# Patient Record
Sex: Female | Born: 1980
Health system: Southern US, Community
[De-identification: ages and names within clinical notes are randomized; demographics above are authoritative.]

## PROBLEM LIST (undated history)

## (undated) DIAGNOSIS — F419 Anxiety disorder, unspecified: Secondary | ICD-10-CM

## (undated) DIAGNOSIS — N83209 Unspecified ovarian cyst, unspecified side: Secondary | ICD-10-CM

## (undated) DIAGNOSIS — Z789 Other specified health status: Secondary | ICD-10-CM

## (undated) DIAGNOSIS — Z973 Presence of spectacles and contact lenses: Secondary | ICD-10-CM

## (undated) DIAGNOSIS — D649 Anemia, unspecified: Secondary | ICD-10-CM

## (undated) DIAGNOSIS — N858 Other specified noninflammatory disorders of uterus: Secondary | ICD-10-CM

## (undated) HISTORY — PX: NO PAST SURGERIES: SHX2092

## (undated) HISTORY — DX: Other specified noninflammatory disorders of uterus: N85.8

## (undated) HISTORY — DX: Anemia, unspecified: D64.9

## (undated) HISTORY — PX: TUBAL LIGATION: SHX77

## (undated) HISTORY — DX: Anxiety disorder, unspecified: F41.9

---

## 2003-04-30 ENCOUNTER — Ambulatory Visit (HOSPITAL_COMMUNITY): Admission: AD | Admit: 2003-04-30 | Discharge: 2003-04-30 | Payer: Self-pay | Admitting: *Deleted

## 2003-05-01 ENCOUNTER — Inpatient Hospital Stay (HOSPITAL_COMMUNITY): Admission: RE | Admit: 2003-05-01 | Discharge: 2003-05-03 | Payer: Self-pay | Admitting: Obstetrics and Gynecology

## 2003-08-26 ENCOUNTER — Emergency Department (HOSPITAL_COMMUNITY): Admission: EM | Admit: 2003-08-26 | Discharge: 2003-08-26 | Payer: Self-pay | Admitting: Emergency Medicine

## 2005-12-02 ENCOUNTER — Emergency Department (HOSPITAL_COMMUNITY): Admission: EM | Admit: 2005-12-02 | Discharge: 2005-12-03 | Payer: Self-pay | Admitting: Emergency Medicine

## 2006-02-17 ENCOUNTER — Ambulatory Visit (HOSPITAL_COMMUNITY): Admission: RE | Admit: 2006-02-17 | Discharge: 2006-02-17 | Payer: Self-pay | Admitting: Obstetrics & Gynecology

## 2006-07-19 ENCOUNTER — Inpatient Hospital Stay (HOSPITAL_COMMUNITY): Admission: AD | Admit: 2006-07-19 | Discharge: 2006-07-21 | Payer: Self-pay | Admitting: Obstetrics & Gynecology

## 2006-07-19 ENCOUNTER — Ambulatory Visit: Payer: Self-pay | Admitting: Obstetrics & Gynecology

## 2011-08-07 ENCOUNTER — Emergency Department
Admit: 2011-08-07 | Discharge: 2011-08-07 | Disposition: A | Discharge status: Home / Self Care | Payer: Self-pay | Source: Emergency Department | Admitting: Emergency Medicine

## 2011-08-07 LAB — URINALYSIS, REFLEX TO MICROSCOPIC EXAM IF INDICATED
Bilirubin, UA: NEGATIVE
Blood, UA: NEGATIVE
Glucose, UA: NEGATIVE
Ketones UA: NEGATIVE
Nitrite, UA: NEGATIVE
Protein, UR: NEGATIVE
Specific Gravity UA POCT: 1.018 (ref 1.001–1.035)
Urine pH: 6 (ref 5.0–8.0)
Urobilinogen, UA: NEGATIVE mg/dL

## 2012-09-26 ENCOUNTER — Emergency Department: Payer: Medicaid Other

## 2012-09-26 ENCOUNTER — Emergency Department
Admission: EM | Admit: 2012-09-26 | Discharge: 2012-09-26 | Disposition: A | Discharge status: Home / Self Care | Payer: Medicaid Other | Attending: Internal Medicine | Admitting: Internal Medicine

## 2012-09-26 DIAGNOSIS — R112 Nausea with vomiting, unspecified: Secondary | ICD-10-CM | POA: Insufficient documentation

## 2012-09-26 DIAGNOSIS — O99891 Other specified diseases and conditions complicating pregnancy: Secondary | ICD-10-CM | POA: Insufficient documentation

## 2012-09-26 LAB — COMPREHENSIVE METABOLIC PANEL
ALT: 9 U/L (ref 0–55)
AST (SGOT): 15 U/L (ref 5–34)
Albumin/Globulin Ratio: 1.2 (ref 0.9–2.2)
Albumin: 4.1 g/dL (ref 3.5–5.0)
Alkaline Phosphatase: 47 U/L (ref 40–150)
Anion Gap: 9 (ref 5.0–15.0)
BUN: 9 mg/dL (ref 7.0–19.0)
Bilirubin, Total: 1.1 mg/dL (ref 0.2–1.2)
CO2: 23 mEq/L (ref 22–29)
Calcium: 9.8 mg/dL (ref 8.5–10.5)
Chloride: 106 mEq/L (ref 98–107)
Creatinine: 0.8 mg/dL (ref 0.6–1.0)
Globulin: 3.3 g/dL (ref 2.0–3.6)
Glucose: 94 mg/dL (ref 70–100)
Potassium: 4.2 mEq/L (ref 3.5–5.1)
Protein, Total: 7.4 g/dL (ref 6.0–8.3)
Sodium: 138 mEq/L (ref 136–145)

## 2012-09-26 LAB — URINALYSIS, REFLEX TO MICROSCOPIC EXAM IF INDICATED
Bilirubin, UA: NEGATIVE
Blood, UA: NEGATIVE
Glucose, UA: NEGATIVE
Nitrite, UA: NEGATIVE
Protein, UR: 30 — AB
Specific Gravity UA: 1.031 (ref 1.001–1.035)
Urine pH: 6 (ref 5.0–8.0)
Urobilinogen, UA: NEGATIVE mg/dL

## 2012-09-26 LAB — CBC AND DIFFERENTIAL
Basophils Absolute Automated: 0.03 10*3/uL (ref 0.00–0.20)
Basophils Automated: 0 %
Eosinophils Absolute Automated: 0.12 10*3/uL (ref 0.00–0.70)
Eosinophils Automated: 2 %
Hematocrit: 36.4 % — ABNORMAL LOW (ref 37.0–47.0)
Hgb: 11.9 g/dL — ABNORMAL LOW (ref 12.0–16.0)
Immature Granulocytes Absolute: 0.01 10*3/uL
Immature Granulocytes: 0 %
Lymphocytes Absolute Automated: 1.58 10*3/uL (ref 0.50–4.40)
Lymphocytes Automated: 25 %
MCH: 26.4 pg — ABNORMAL LOW (ref 28.0–32.0)
MCHC: 32.7 g/dL (ref 32.0–36.0)
MCV: 80.7 fL (ref 80.0–100.0)
MPV: 11.1 fL (ref 9.4–12.3)
Monocytes Absolute Automated: 0.62 10*3/uL (ref 0.00–1.20)
Monocytes: 10 %
Neutrophils Absolute: 3.98 10*3/uL (ref 1.80–8.10)
Neutrophils: 63 %
Nucleated RBC: 0 /100 WBC (ref 0–1)
Platelets: 215 10*3/uL (ref 140–400)
RBC: 4.51 10*6/uL (ref 4.20–5.40)
RDW: 15 % (ref 12–15)
WBC: 6.33 10*3/uL (ref 3.50–10.80)

## 2012-09-26 LAB — HCG QUANTITATIVE: hCG, Quant.: 68629.1 m[IU]/mL

## 2012-09-26 LAB — POCT PREGNANCY TEST, URINE HCG: POCT Pregnancy HCG Test, UR: POSITIVE — AB

## 2012-09-26 LAB — LIPASE: Lipase: 17 U/L (ref 8–78)

## 2012-09-26 LAB — GFR: EGFR: >60

## 2012-09-26 LAB — HEMOLYSIS INDEX: Hemolysis Index: 4 Index (ref 0–18)

## 2012-09-26 MED ORDER — ONDANSETRON HCL 4 MG/2ML IJ SOLN
4.00 mg | Freq: Once | INTRAMUSCULAR | Status: AC
Start: 2012-09-26 — End: 2012-09-26
  Administered 2012-09-26: 4 mg via INTRAVENOUS
  Filled 2012-09-26: qty 2

## 2012-09-26 MED ORDER — SODIUM CHLORIDE 0.9 % IV BOLUS
1000.00 mL | Freq: Once | INTRAVENOUS | Status: AC
Start: 2012-09-26 — End: 2012-09-26
  Administered 2012-09-26: 1000 mL via INTRAVENOUS

## 2012-09-26 MED ORDER — ONDANSETRON 4 MG PO TBDP
4.00 mg | ORAL_TABLET | Freq: Four times a day (QID) | ORAL | Status: DC | PRN
Start: 2012-09-26 — End: 2016-01-28

## 2012-09-26 NOTE — ED Provider Notes (Signed)
EMERGENCY DEPARTMENT HISTORY AND PHYSICAL EXAM     Physician/Midlevel provider first contact with patient: 09/26/12 1317         Date: 09/26/2012  Patient Name: Debra Norman    History of Presenting Illness     Chief Complaint   Patient presents with   . Emesis     History Provided By: patient     Chief Complaint: vomiting   Onset: 1 week  Timing: persistent   Location: GI  Quality: aching   Severity: moderate   Modifying Factors:none    Associated Symptoms: nausea, abdominal pain      Additional History: Debra Norman is a 32 y.o. female P3/G2/A0, pregnant, presents to ED c/o persistent vomiting x 1 week which is dry-heaving.  Reports nausea and abdominal ache (feels like nausea). Denies burning w/ urination, constipation, diarrhea, vaginal bleeding. Pt reports a positive home pregnancy test. Pt has an upcoming appointment with an OB/GYN at the [facility] in 3 days. LNMP x ?08/07/2012. NKDA.     PCP: Pcp, Noneorunknown, MD      Current Facility-Administered Medications   Medication Dose Route Frequency Provider Last Rate Last Dose   . [COMPLETED] ondansetron (ZOFRAN) injection 4 mg  4 mg Intravenous Once Azzie Glatter, MD   4 mg at 09/26/12 1345   . [COMPLETED] sodium chloride 0.9 % bolus 1,000 mL  1,000 mL Intravenous Once Azzie Glatter, MD   1,000 mL at 09/26/12 1346     Current Outpatient Prescriptions   Medication Sig Dispense Refill   . ondansetron (ZOFRAN ODT) 4 MG disintegrating tablet Take 1 tablet (4 mg total) by mouth every 6 (six) hours as needed for Nausea.  20 tablet  0       Past History     Past Medical History:  History reviewed. No pertinent past medical history.    Past Surgical History:  History reviewed. No pertinent past surgical history.    Family History:  History reviewed. No pertinent family history.    Social History:  History   Substance Use Topics   . Smoking status: Never Smoker    . Smokeless tobacco: Not on file   . Alcohol Use: No       Allergies:  No Known Allergies    Review of Systems      Review of Systems   Constitutional: Negative for fever, chills and diaphoresis.   HENT: Negative for nosebleeds, congestion and rhinorrhea.    Eyes: Negative for pain, redness and visual disturbance.   Respiratory: Negative for cough and shortness of breath.    Cardiovascular: Negative for chest pain and leg swelling.   Gastrointestinal: Positive for nausea, vomiting and abdominal pain. Negative for diarrhea and constipation.   Genitourinary: Negative for dysuria, decreased urine volume and difficulty urinating.   Musculoskeletal: Negative for myalgias and back pain.   Skin: Negative for pallor and rash.   Neurological: Negative for dizziness and headaches.         Physical Exam   BP 101/58  Pulse 77  Temp 99.6 F (37.6 C) (Oral)  Resp 18  Ht 1.626 m  Wt 60.328 kg  BMI 22.82 kg/m2  SpO2 99%  LMP 10/09/2011  Physical Exam   Nursing note and vitals reviewed.  Constitutional: She is oriented to person, place, and time. She appears well-developed and well-nourished. No distress.   HENT:   Head: Normocephalic and atraumatic.   Eyes: Right eye exhibits no discharge. Left eye exhibits no discharge. No scleral  icterus.   Neck: Normal range of motion. Neck supple.   Cardiovascular: Normal rate, regular rhythm and normal heart sounds.    Pulmonary/Chest: Effort normal and breath sounds normal. No respiratory distress. She has no wheezes. She has no rales.   Abdominal: Soft. Bowel sounds are normal. She exhibits no distension. There is no tenderness. There is no rebound and no guarding.   Musculoskeletal: She exhibits no edema and no tenderness.   Neurological: She is alert and oriented to person, place, and time.   Skin: Skin is warm and dry. She is not diaphoretic.   Psychiatric: She has a normal mood and affect. Judgment normal.       Diagnostic Study Results     Labs -     Results     Procedure Component Value Units Date/Time    Beta HCG Quant Serum [[id number]] Collected:09/26/12 1333     hCG, Quant. 68629.1  mIU/mL Updated:09/26/12 1416    Comprehensive Metabolic Panel (CMP) [[id number]] Collected:09/26/12 1333    Specimen Information:Blood Updated:09/26/12 1354     Glucose 94 mg/dL      BUN 9.0 mg/dL      Creatinine 0.8 mg/dL      Sodium 244 mEq/L      Potassium 4.2 mEq/L      Chloride 106 mEq/L      CO2 23 mEq/L      Calcium 9.8 mg/dL      Protein, Total 7.4 g/dL      Albumin 4.1 g/dL      AST (SGOT) 15 U/L      ALT 9 U/L      Alkaline Phosphatase 47 U/L      Bilirubin, Total 1.1 mg/dL      Globulin 3.3 g/dL      Albumin/Globulin Ratio 1.2      Anion Gap 9.0     Lipase [[id number]] Collected:09/26/12 1333    Specimen Information:Blood Updated:09/26/12 1354     Lipase 17 U/L     HEMOLYZED INDEX [[id number]] Collected:09/26/12 1333     Hemolyzed Index 4 Index Updated:09/26/12 1354    GFR [[id number]] Collected:09/26/12 1333     EGFR >60.0 Updated:09/26/12 1354    UA, Reflex to Microscopic [[id number]]  (Abnormal) Collected:09/26/12 1333    Specimen Information:Urine Updated:09/26/12 1348     Urine Type Clean Catch      Color, UA Yellow      Clarity, UA Hazy      Specific Gravity UA 1.031      Urine pH 6.0      Leukocytes, UA Trace (A)      Nitrite, UA Negative      Protein, UA 30 (A)      Glucose, UA Negative      Ketones UA Trace (A)      Urobilinogen, UA Negative mg/dL      Bilirubin, UA Negative      Blood, UA Negative      RBC, UA 0 - 5 /HPF      WBC, UA 0 - 5 /HPF      Squamous Epithelial Cells, Urine 6 - 10 /HPF      Urine Mucus Present     CBC and differential [[id number]]  (Abnormal) Collected:09/26/12 1333    Specimen Information:Blood / Blood Updated:09/26/12 1340     WBC 6.33 x10 3/uL      RBC 4.51 x10 6/uL      Hgb 11.9 (L) g/dL      Hematocrit 60.1 (L) %  MCV 80.7 fL      MCH 26.4 (L) pg      MCHC 32.7 g/dL      RDW 15 %      Platelets 215 x10 3/uL      MPV 11.1 fL      Neutrophils 63 %      Lymphocytes Automated 25 %      Monocytes 10 %      Eosinophils Automated 2 %      Basophils Automated 0 %       Immature Granulocyte 0 %      Nucleated RBC 0 /100 WBC      Neutrophils Absolute 3.98 x10 3/uL      Abs Lymph Automated 1.58 x10 3/uL      Abs Mono Automated 0.62 x10 3/uL      Abs Eos Automated 0.12 x10 3/uL      Absolute Baso Automated 0.03 x10 3/uL      Absolute Immature Granulocyte 0.01 x10 3/uL     POCT Pregnancy Test, Urine HCG [[id number]]  (Abnormal) Collected:09/26/12 1311     POCT QC Pass Updated:09/26/12 1312     POCT Pregnancy HCG Test, UR Positive (A)      Comment:        Result:     Negative Value is Normal in Healthy Males or Healthy non-pregnant Females          Radiologic Studies -   Radiology Results (24 Hour)     ** No Results found for the last 24 hours. **      .      Medical Decision Making   I am the first provider for this patient.    I reviewed the vital signs, available nursing notes, past medical history, past surgical history, family history and social history.    Vital Signs- Reviewed the patient's vital signs.  Patient Vitals for the past 12 hrs:   BP Temp Pulse Resp   09/26/12 1448 101/58 mmHg 99.6 F (37.6 C) 77  18    09/26/12 1249 105/61 mmHg 96.4 F (35.8 C) 95  20      Pulse Oximetry Analysis - Normal 99% on RA    Old Medical Records: Old medical records.  Nursing notes.  Vital signs.      ED Course:   2:33 PM  Pt is feeling better and would like to go home.  Discussed test results with pt and counseled on diagnosis, f/u plans, and signs and symptoms when to return to ED.  Pt is stable and ready for discharge.      Provider Notes: pt comfortable on discharge.    Diagnosis     Clinical Impression:   1. Nausea and vomiting in pregnancy        _______________________________    Attestations:  This note is prepared by Karlton Lemon, acting as Scribe for Azzie Glatter, MD.     Azzie Glatter, MD: The scribe's documentation has been prepared under my direction and personally reviewed by me in its entirety. I confirm that the note above accurately reflects all work, treatment, procedures,  and medical decision making performed by me.      _______________________________        Azzie Glatter, MD  09/26/12 [phone]

## 2012-09-26 NOTE — ED Notes (Signed)
DCI reviewed.  Patient discharged by Dr. Nowak.

## 2012-09-26 NOTE — ED Notes (Signed)
Nausea, vomiting x1 week, generally tired.  Took a home pregnancy test Sunday which was positive and another on Thursday which was also positive.  Patient states she doesn't think she is pregnant.

## 2012-09-29 ENCOUNTER — Emergency Department: Payer: Medicaid Other

## 2012-09-29 ENCOUNTER — Emergency Department
Admission: EM | Admit: 2012-09-29 | Discharge: 2012-09-30 | Disposition: A | Discharge status: Home / Self Care | Payer: Medicaid Other | Attending: Emergency Medicine | Admitting: Emergency Medicine

## 2012-09-29 DIAGNOSIS — O468X9 Other antepartum hemorrhage, unspecified trimester: Secondary | ICD-10-CM | POA: Insufficient documentation

## 2012-09-29 NOTE — ED Provider Notes (Signed)
Physician/Midlevel provider first contact with patient: 09/29/12 2314            Date: 09/29/2012  Patient Name: Debra Norman  Chief Complaint:   Chief Complaint   Patient presents with   . Vaginal Bleeding         History Provided By: the patient.  Translation services was not used.  Sign language services was not used.    HPI      Chief Complaint   Vaginal Bleeding      The patient complains of vaginal bleeding. Onset of symptoms was abrupt starting 2.5 hours ago. Severity of symptoms now is mild. Symptoms occur spontaneously at rest. Symptoms have been constant. Symptoms are aggravated by nothing, alleviated by nothing and are associated with nausea and vomiting. Symptoms are described as light pink. Pt confirms pregnancy. LMP was 08/07/2012. Previous episodes include emergency room visit on 2 days ago for nausea and emesis. At her visit she discovered she was [redacted] weeks pregnant. Pt's risk factors for ectopic are none.            PCP: Pcp, Noneorunknown, MD    LMP: Patient's last menstrual period was 08/07/2012.    GYN HX:   Obstetric History    G3   P2   T0   P0   A0   TAB0   SAB0   E0   M0   L0        Blood Type: B+    PMH    History reviewed. No pertinent past medical history.    PSH    History reviewed. No pertinent past surgical history.    FH    History reviewed. No pertinent family history.    SH    History   Substance Use Topics   . Smoking status: Never Smoker    . Smokeless tobacco: Not on file   . Alcohol Use: No       ALLERGIES    No Known Allergies    CURRENT MEDS    No current facility-administered medications for this encounter.     Current Outpatient Prescriptions   Medication Sig Dispense Refill   . ondansetron (ZOFRAN ODT) 4 MG disintegrating tablet Take 1 tablet (4 mg total) by mouth every 6 (six) hours as needed for Nausea.  20 tablet  0           ROS    Review of Systems   Constitutional: Negative for fever, diaphoresis, activity change, appetite change and fatigue.   HENT: Negative for ear  pain, sore throat, rhinorrhea, neck pain and neck stiffness.    Eyes: Negative for pain, discharge, redness and visual disturbance.   Respiratory: Negative for apnea, cough, chest tightness, shortness of breath, wheezing and stridor.    Cardiovascular: Negative for chest pain, palpitations and leg swelling.   Gastrointestinal: Positive for nausea and vomiting. Negative for abdominal pain, diarrhea, blood in stool and abdominal distention.   Genitourinary: Positive for vaginal bleeding.   Musculoskeletal: Negative for myalgias and arthralgias.   Skin: Negative for color change, pallor and rash.   Neurological: Negative for dizziness, seizures, syncope, speech difficulty, weakness, numbness and headaches.   Hematological: Does not bruise/bleed easily.                 PHYSICAL EXAM    Vital Signs: BP 103/55  Pulse 86  Temp 99.5 F (37.5 C)  Resp 18  Ht 1.626 m  Wt 60.328 kg  BMI 22.82 kg/m2  SpO2 99%  LMP 08/07/2012        Pulse Oximetry: 99%   (RA unless specified)          Physical Exam   Nursing note and vitals reviewed.  Constitutional: She is oriented to person, place, and time. She appears well-developed and well-nourished. No distress.   HENT:   Head: Normocephalic and atraumatic.   Right Ear: External ear normal.   Left Ear: External ear normal.   Mouth/Throat: Oropharynx is clear and moist. No oropharyngeal exudate.   Eyes: Conjunctivae normal and EOM are normal. Pupils are equal, round, and reactive to light.   Neck: Normal range of motion. Neck supple. No JVD present.   Cardiovascular: Normal rate, regular rhythm and normal heart sounds.    Pulmonary/Chest: Effort normal and breath sounds normal. No stridor. No respiratory distress. She has no wheezes. She has no rales. She exhibits no tenderness.   Abdominal: Soft. She exhibits no distension and no mass. There is no tenderness. There is no rebound and no guarding.   Musculoskeletal: Normal range of motion.   Neurological: She is alert and oriented  to person, place, and time. No cranial nerve deficit.   Skin: Skin is warm and dry. No rash noted. She is not diaphoretic. No erythema. No pallor.   Psychiatric: She has a normal mood and affect. Her behavior is normal. Judgment and thought content normal.          EKG/MONITOR/OLD EKG    Current EKG: not performed    Old EKG:   not reviewed or none available    Cardiac Monitor: not monitored      LAB RESULTS    Results     Procedure Component Value Units Date/Time    ABO/Rh [[id number]] Collected:09/29/12 2345    Specimen Information:Blood Updated:09/30/12 0040     ABO Rh B POS     Beta HCG, Quant, Serum [[id number]] Collected:09/29/12 2345     hCG, Quant. 98403.1 mIU/mL Updated:09/30/12 0030    CBC and differential [[id number]]  (Abnormal) Collected:09/29/12 2345    Specimen Information:Blood / Blood Updated:09/30/12 0000     WBC 6.66 x10 3/uL      RBC 4.51 x10 6/uL      Hgb 11.9 (L) g/dL      Hematocrit 41.3 (L) %      MCV 80.7 fL      MCH 26.4 (L) pg      MCHC 32.7 g/dL      RDW 15 %      Platelets 226 x10 3/uL      MPV 11.3 fL      Neutrophils 53 %      Lymphocytes Automated 34 %      Monocytes 10 %      Eosinophils Automated 2 %      Basophils Automated 1 %      Immature Granulocyte 0 %      Nucleated RBC 1 /100 WBC      Neutrophils Absolute 3.52 x10 3/uL      Abs Lymph Automated 2.29 x10 3/uL      Abs Mono Automated 0.68 x10 3/uL      Abs Eos Automated 0.13 x10 3/uL      Absolute Baso Automated 0.04 x10 3/uL      Absolute Immature Granulocyte 0.01 x10 3/uL           RADIOLOGY RESULTS    Radiology Results (24 Hour)     ** No Results found  for the last 24 hours. **          CONSULTATIONS/NOTES      The reason(s) for the excessive length of stay for this patient include the following: still awaiting sonogram. It was ordered at 2325.  12:50 AM - Discussed test results with pt and counseled on diagnosis, f/u plans, and signs and symptoms when to return to ED.  Pt is stable and ready for discharge.       PROCEDURES    none    CRITICAL CARE TIME    none        DIAGNOSES  Primary diagnosis is in boldface    1. Live 7.2 week IUP    2. Subchorionic hemorrhage of placenta, first trimester        DISPOSITION    discharged home     DISPOSITION CONDITION    unchanged    Patient Vitals for the past 12 hrs:   BP Temp Pulse Resp   09/29/12 2255 103/55 mmHg 99.5 F (37.5 C) 86  18         Pulse Oximetry: 99%   (RA unless specified)      MEDICAL DECISION MAKING    Old records reviewed: no    Old EKG reviewed: not examined    Discussed with consultant(s) and admitting MD: no    Discussed with patient and/or family members: yes    Acuity level of diagnosis: medium/moderate complexity    Acuity level of procedures: not applicable    Complexity of differential diagnosis: medium/moderate complexity      CHART RECONCILIATION: CHART OWNERSHIP: Dr. Gaylord Shih is the primary emergency physician of record.              Harless Litten, MD  09/30/12 [phone]

## 2012-09-29 NOTE — ED Notes (Signed)
Pt reports went to OB-GYN checkup today with unremarkable report.  Pt reports bleeding started 2200 light pink one pad, denies pain

## 2012-09-30 LAB — CBC AND DIFFERENTIAL
Basophils Absolute Automated: 0.04 10*3/uL (ref 0.00–0.20)
Basophils Automated: 1 %
Eosinophils Absolute Automated: 0.13 10*3/uL (ref 0.00–0.70)
Eosinophils Automated: 2 %
Hematocrit: 36.4 % — ABNORMAL LOW (ref 37.0–47.0)
Hgb: 11.9 g/dL — ABNORMAL LOW (ref 12.0–16.0)
Immature Granulocytes Absolute: 0.01 10*3/uL
Immature Granulocytes: 0 %
Lymphocytes Absolute Automated: 2.29 10*3/uL (ref 0.50–4.40)
Lymphocytes Automated: 34 %
MCH: 26.4 pg — ABNORMAL LOW (ref 28.0–32.0)
MCHC: 32.7 g/dL (ref 32.0–36.0)
MCV: 80.7 fL (ref 80.0–100.0)
MPV: 11.3 fL (ref 9.4–12.3)
Monocytes Absolute Automated: 0.68 10*3/uL (ref 0.00–1.20)
Monocytes: 10 %
Neutrophils Absolute: 3.52 10*3/uL (ref 1.80–8.10)
Neutrophils: 53 %
Nucleated RBC: 1 /100 WBC (ref 0–1)
Platelets: 226 10*3/uL (ref 140–400)
RBC: 4.51 10*6/uL (ref 4.20–5.40)
RDW: 15 % (ref 12–15)
WBC: 6.66 10*3/uL (ref 3.50–10.80)

## 2012-09-30 LAB — HCG QUANTITATIVE: hCG, Quant.: 98403.1 m[IU]/mL

## 2012-09-30 LAB — ABO/RH: ABO Rh: B POS

## 2012-09-30 NOTE — Discharge Instructions (Signed)
Pregnancy, IUP Confirmed     It has been determined that you are pregnant.     You have an intra-uterine pregnancy or IUP. This means that your fetus (baby) is growing inside your uterus. This is normal. Some fetuses develop inside the fallopian tubes. This is called an ectopic pregnancy, and it is very dangerous.     Your doctor determined you have an IUP by examining you, conducting lab tests and viewing an ultrasound of your pelvis.     There is an extremely small chance that you could have an IUP AND a tubal pregnancy at the same time. This happens when there is a fetus growing in the uterus and a second fetus growing in a fallopian tube. This is called a heterotopic pregnancy. About 1 in every 30,000 pregnancies is heterotopic. This condition is EXTREMELY RARE, but it can be life-threatening.     At this time, there is no evidence that you have an ectopic (or heterotopic) pregnancy. Your physician believes it is safe for you to go home.     You will need to follow up with an obstetrician who can care for you during your pregnancy. If you do not already have an obstetrician, the medical staff here can help you to find one.     Make sure you see an obstetrician as soon as possible, certainly within the next few weeks. Tell your regular doctor about this visit and your pregnancy.     You should avoid taking most medications while you are pregnant unless your doctor says it is okay. It is safe to use Tylenol (acetaminophen), but be sure to follow the directions on the package.     Here are some things that you can do to keep your baby healthy:  · If you smoke, STOP! Smoking can hurt your fetus (baby) and you. Smoking also makes it more likely that you will have a miscarriage (lose the baby).  · Abusing alcohol or other drugs will hurt your fetus (baby). Risks include abnormal development, premature birth, severe health and mental problems once the child is born, and miscarriage. Avoid using alcohol or other drugs  until after your baby is born.  · If you are taking medications, including antipsychotics, antidepressants, or seizure medicine, check with your regular doctor right away. Your doctor can tell you whether it is safe to keep taking these drugs during your pregnancy.  · Keep yourself well-hydrated at all times by drinking a lot of water. Eat a balanced, healthy, nutritious diet.  · Take prenatal vitamins every day. These may be prescribed for you on this visit, or you may need to get them through your follow-up care provider. You can also purchase an over-the-counter equivalent which may be a lot less expensive. Ask you pharmacist for details.     You may need to return here or to your Obstetrician if you develop any other symptoms that might make you believe your are having complications with your pregnancy.     YOU SHOULD SEEK MEDICAL ATTENTION IMMEDIATELY, EITHER HERE OR AT THE NEAREST EMERGENCY DEPARTMENT, IF ANY OF THE FOLLOWING OCCURS:  · Nausea, vomiting, fever, or chills.  · Increasing pain in the abdomen (belly), pelvis or back.  · Passing large clots or fetal tissue or heavy vaginal bleeding that soaks a pad or tampon all the way through (more than one pad per hour).  · Dizziness, lightheadedness, or passing out.

## 2014-08-19 NOTE — L&D Delivery Note (Cosign Needed Addendum)
Pt was admitted in labor. She had a AROM with clear fluid. She continued to progress spontaneously. I contacted the nursing service to check pt since I had not received an update in awhile. One min later I received a call stating that she was complete and crowning. She then had a SVD over an intact perineum. Placenta S/I. EBL-400cc. Baby to NBN. She desires a BTL. Will proceed to the OR for BTL.    Delivery Note At 12:40 PM a viable female was delivered via Vaginal, Spontaneous Delivery (Presentation: Left Occiput Anterior).  APGAR: 8, 9; weight 7 lb 9 oz (3430 g).   Placenta status: Intact, Spontaneous.  Cord: 3 vessels with the following complications: None.  Cord pH: not collected  Anesthesia: Epidural  Episiotomy: None Lacerations: None Suture Repair: na Est. Blood Loss (mL): 350  I was called to the room for emergent delivery. Infant was at the perineum with a full crown. I delivered the placenta and provided verbal sign out to her OB provider.    Juanita Craver [facility] 07/09/2015, 5:52 PM

## 2015-01-03 LAB — OB RESULTS CONSOLE HEPATITIS B SURFACE ANTIGEN: HEP B S AG: NEGATIVE

## 2015-01-03 LAB — OB RESULTS CONSOLE ABO/RH: RH TYPE: NEGATIVE

## 2015-01-03 LAB — OB RESULTS CONSOLE RPR: RPR: NONREACTIVE

## 2015-01-03 LAB — OB RESULTS CONSOLE HIV ANTIBODY (ROUTINE TESTING): HIV: NONREACTIVE

## 2015-01-03 LAB — OB RESULTS CONSOLE RUBELLA ANTIBODY, IGM: Rubella: IMMUNE

## 2015-06-21 LAB — OB RESULTS CONSOLE GBS: STREP GROUP B AG: POSITIVE

## 2015-07-09 ENCOUNTER — Encounter (HOSPITAL_COMMUNITY): Payer: Self-pay | Admitting: *Deleted

## 2015-07-09 ENCOUNTER — Inpatient Hospital Stay (HOSPITAL_COMMUNITY): Payer: Medicaid Other | Admitting: Anesthesiology

## 2015-07-09 ENCOUNTER — Inpatient Hospital Stay (HOSPITAL_COMMUNITY)
Admission: AD | Admit: 2015-07-09 | Discharge: 2015-07-11 | DRG: 767 | Disposition: A | Discharge status: Home / Self Care | Payer: Medicaid Other | Source: Ambulatory Visit | Attending: Obstetrics and Gynecology | Admitting: Obstetrics and Gynecology

## 2015-07-09 ENCOUNTER — Encounter (HOSPITAL_COMMUNITY)
Admission: AD | Disposition: A | Discharge status: Home / Self Care | Payer: Self-pay | Source: Ambulatory Visit | Attending: Obstetrics and Gynecology

## 2015-07-09 DIAGNOSIS — Z3A39 39 weeks gestation of pregnancy: Secondary | ICD-10-CM

## 2015-07-09 DIAGNOSIS — O26893 Other specified pregnancy related conditions, third trimester: Secondary | ICD-10-CM | POA: Diagnosis present

## 2015-07-09 DIAGNOSIS — O99824 Streptococcus B carrier state complicating childbirth: Secondary | ICD-10-CM | POA: Diagnosis present

## 2015-07-09 DIAGNOSIS — Z302 Encounter for sterilization: Secondary | ICD-10-CM | POA: Diagnosis not present

## 2015-07-09 DIAGNOSIS — Z3480 Encounter for supervision of other normal pregnancy, unspecified trimester: Secondary | ICD-10-CM

## 2015-07-09 HISTORY — PX: TUBAL LIGATION: SHX77

## 2015-07-09 HISTORY — DX: Other specified health status: Z78.9

## 2015-07-09 LAB — CBC
HCT: 31 % — ABNORMAL LOW (ref 36.0–46.0)
Hemoglobin: 9.9 g/dL — ABNORMAL LOW (ref 12.0–15.0)
MCH: 24.9 pg — ABNORMAL LOW (ref 26.0–34.0)
MCHC: 31.9 g/dL (ref 30.0–36.0)
MCV: 77.9 fL — AB (ref 78.0–100.0)
PLATELETS: 160 10*3/uL (ref 150–400)
RBC: 3.98 MIL/uL (ref 3.87–5.11)
RDW: 17.8 % — AB (ref 11.5–15.5)
WBC: 5.4 10*3/uL (ref 4.0–10.5)

## 2015-07-09 LAB — ABO/RH: ABO/RH(D): B POS

## 2015-07-09 LAB — TYPE AND SCREEN
ABO/RH(D): B POS
ANTIBODY SCREEN: NEGATIVE

## 2015-07-09 LAB — RPR: RPR Ser Ql: NONREACTIVE

## 2015-07-09 SURGERY — LIGATION, FALLOPIAN TUBE, POSTPARTUM
Anesthesia: Epidural | Site: Abdomen | Laterality: Bilateral

## 2015-07-09 MED ORDER — OXYCODONE-ACETAMINOPHEN 5-325 MG PO TABS: 2.0000 | ORAL_TABLET | ORAL | Status: DC | PRN

## 2015-07-09 MED ORDER — ONDANSETRON HCL 4 MG/2ML IJ SOLN
4.0000 mg | Freq: Four times a day (QID) | INTRAMUSCULAR | Status: DC | PRN
  Administered 2015-07-09: 4 mg via INTRAVENOUS

## 2015-07-09 MED ORDER — ACETAMINOPHEN 325 MG PO TABS: 650.0000 mg | ORAL_TABLET | ORAL | Status: DC | PRN

## 2015-07-09 MED ORDER — FENTANYL 2.5 MCG/ML BUPIVACAINE 1/10 % EPIDURAL INFUSION (WH - ANES)
14.0000 mL/h | INTRAMUSCULAR | Status: DC | PRN
  Administered 2015-07-09: 14 mL/h via EPIDURAL
  Filled 2015-07-09: qty 125

## 2015-07-09 MED ORDER — LIDOCAINE HCL (PF) 1 % IJ SOLN
30.0000 mL | INTRAMUSCULAR | Status: DC | PRN
  Filled 2015-07-09: qty 30

## 2015-07-09 MED ORDER — FLEET ENEMA 7-19 GM/118ML RE ENEM: 1.0000 | ENEMA | RECTAL | Status: DC | PRN

## 2015-07-09 MED ORDER — LIDOCAINE-EPINEPHRINE (PF) 2 %-1:200000 IJ SOLN
INTRAMUSCULAR | Status: DC | PRN
  Administered 2015-07-09: 2 mL via EPIDURAL
  Administered 2015-07-09 (×2): 3 mL via EPIDURAL

## 2015-07-09 MED ORDER — ACETAMINOPHEN 325 MG PO TABS: 325.0000 mg | ORAL_TABLET | ORAL | Status: DC | PRN

## 2015-07-09 MED ORDER — PENICILLIN G POTASSIUM 5000000 UNITS IJ SOLR
2.5000 10*6.[IU] | INTRAVENOUS | Status: DC
  Administered 2015-07-09: 2.5 10*6.[IU] via INTRAVENOUS
  Filled 2015-07-09 (×6): qty 2.5

## 2015-07-09 MED ORDER — SIMETHICONE 80 MG PO CHEW: 80.0000 mg | CHEWABLE_TABLET | ORAL | Status: DC | PRN

## 2015-07-09 MED ORDER — IBUPROFEN 600 MG PO TABS: 600.0000 mg | ORAL_TABLET | Freq: Four times a day (QID) | ORAL | Status: DC

## 2015-07-09 MED ORDER — BENZOCAINE-MENTHOL 20-0.5 % EX AERO: 1.0000 "application " | INHALATION_SPRAY | CUTANEOUS | Status: DC | PRN

## 2015-07-09 MED ORDER — FENTANYL CITRATE (PF) 100 MCG/2ML IJ SOLN: 25.0000 ug | INTRAMUSCULAR | Status: DC | PRN

## 2015-07-09 MED ORDER — LACTATED RINGERS IV SOLN
INTRAVENOUS | Status: DC
Start: 2015-07-09 — End: 2015-07-09
  Administered 2015-07-09 (×3): via INTRAVENOUS

## 2015-07-09 MED ORDER — CEFAZOLIN SODIUM-DEXTROSE 2-3 GM-% IV SOLR
INTRAVENOUS | Status: DC | PRN
  Administered 2015-07-09: 2 g via INTRAVENOUS

## 2015-07-09 MED ORDER — OXYTOCIN 40 UNITS IN LACTATED RINGERS INFUSION - SIMPLE MED
62.5000 mL/h | INTRAVENOUS | Status: DC
  Administered 2015-07-09: 40 mL via INTRAVENOUS
  Filled 2015-07-09: qty 1000

## 2015-07-09 MED ORDER — OXYTOCIN BOLUS FROM INFUSION: 500.0000 mL | INTRAVENOUS | Status: DC

## 2015-07-09 MED ORDER — IBUPROFEN 600 MG PO TABS
600.0000 mg | ORAL_TABLET | Freq: Four times a day (QID) | ORAL | Status: DC
  Administered 2015-07-09 – 2015-07-11 (×8): 600 mg via ORAL
  Filled 2015-07-09 (×8): qty 1

## 2015-07-09 MED ORDER — BUPIVACAINE HCL (PF) 0.25 % IJ SOLN
INTRAMUSCULAR | Status: DC | PRN
  Administered 2015-07-09 (×2): 4 mL via EPIDURAL

## 2015-07-09 MED ORDER — LIDOCAINE-EPINEPHRINE (PF) 2 %-1:200000 IJ SOLN
INTRAMUSCULAR | Status: DC | PRN
  Administered 2015-07-09: 3 mL

## 2015-07-09 MED ORDER — WITCH HAZEL-GLYCERIN EX PADS: 1.0000 "application " | MEDICATED_PAD | CUTANEOUS | Status: DC | PRN

## 2015-07-09 MED ORDER — ONDANSETRON HCL 4 MG/2ML IJ SOLN: 4.0000 mg | INTRAMUSCULAR | Status: DC | PRN

## 2015-07-09 MED ORDER — EPHEDRINE 5 MG/ML INJ: 10.0000 mg | INTRAVENOUS | Status: DC | PRN

## 2015-07-09 MED ORDER — ZOLPIDEM TARTRATE 5 MG PO TABS: 5.0000 mg | ORAL_TABLET | Freq: Every evening | ORAL | Status: DC | PRN

## 2015-07-09 MED ORDER — BUPIVACAINE HCL (PF) 0.25 % IJ SOLN
INTRAMUSCULAR | Status: AC
  Filled 2015-07-09: qty 30

## 2015-07-09 MED ORDER — SENNOSIDES-DOCUSATE SODIUM 8.6-50 MG PO TABS
2.0000 | ORAL_TABLET | ORAL | Status: DC
  Administered 2015-07-09 – 2015-07-11 (×2): 2 via ORAL
  Filled 2015-07-09 (×2): qty 2

## 2015-07-09 MED ORDER — MIDAZOLAM HCL 2 MG/2ML IJ SOLN
INTRAMUSCULAR | Status: AC
  Filled 2015-07-09: qty 2

## 2015-07-09 MED ORDER — CITRIC ACID-SODIUM CITRATE 334-500 MG/5ML PO SOLN: 30.0000 mL | ORAL | Status: DC | PRN

## 2015-07-09 MED ORDER — LACTATED RINGERS IV SOLN: 500.0000 mL | INTRAVENOUS | Status: DC | PRN

## 2015-07-09 MED ORDER — TETANUS-DIPHTH-ACELL PERTUSSIS 5-2.5-18.5 LF-MCG/0.5 IM SUSP: 0.5000 mL | Freq: Once | INTRAMUSCULAR | Status: DC

## 2015-07-09 MED ORDER — MEASLES, MUMPS & RUBELLA VAC ~~LOC~~ INJ: 0.5000 mL | INJECTION | Freq: Once | SUBCUTANEOUS | Status: DC

## 2015-07-09 MED ORDER — ONDANSETRON HCL 4 MG PO TABS: 4.0000 mg | ORAL_TABLET | ORAL | Status: DC | PRN

## 2015-07-09 MED ORDER — PHENYLEPHRINE 40 MCG/ML (10ML) SYRINGE FOR IV PUSH (FOR BLOOD PRESSURE SUPPORT)
80.0000 ug | PREFILLED_SYRINGE | INTRAVENOUS | Status: DC | PRN
  Filled 2015-07-09: qty 20

## 2015-07-09 MED ORDER — DIBUCAINE 1 % RE OINT: 1.0000 "application " | TOPICAL_OINTMENT | RECTAL | Status: DC | PRN

## 2015-07-09 MED ORDER — OXYCODONE-ACETAMINOPHEN 5-325 MG PO TABS: 1.0000 | ORAL_TABLET | ORAL | Status: DC | PRN

## 2015-07-09 MED ORDER — MEASLES, MUMPS & RUBELLA VAC ~~LOC~~ INJ
0.5000 mL | INJECTION | Freq: Once | SUBCUTANEOUS | Status: DC
  Filled 2015-07-09: qty 0.5

## 2015-07-09 MED ORDER — MIDAZOLAM HCL 2 MG/2ML IJ SOLN
INTRAMUSCULAR | Status: DC | PRN
  Administered 2015-07-09 (×2): 1 mg via INTRAVENOUS

## 2015-07-09 MED ORDER — WITCH HAZEL-GLYCERIN EX PADS
1.0000 | MEDICATED_PAD | CUTANEOUS | Status: DC | PRN
Start: 2015-07-09 — End: 2015-07-09

## 2015-07-09 MED ORDER — OXYCODONE HCL 5 MG PO TABS
5.0000 mg | ORAL_TABLET | Freq: Once | ORAL | Status: DC | PRN
Start: 2015-07-09 — End: 2015-07-09

## 2015-07-09 MED ORDER — DIPHENHYDRAMINE HCL 50 MG/ML IJ SOLN: 12.5000 mg | INTRAMUSCULAR | Status: DC | PRN

## 2015-07-09 MED ORDER — PENICILLIN G POTASSIUM 5000000 UNITS IJ SOLR
5.0000 10*6.[IU] | Freq: Once | INTRAVENOUS | Status: AC
  Administered 2015-07-09: 5 10*6.[IU] via INTRAVENOUS
  Filled 2015-07-09: qty 5

## 2015-07-09 MED ORDER — BUTORPHANOL TARTRATE 1 MG/ML IJ SOLN
1.0000 mg | INTRAMUSCULAR | Status: DC | PRN
  Administered 2015-07-09: 1 mg via INTRAVENOUS
  Filled 2015-07-09: qty 1

## 2015-07-09 MED ORDER — OXYCODONE-ACETAMINOPHEN 5-325 MG PO TABS
1.0000 | ORAL_TABLET | ORAL | Status: DC | PRN
  Administered 2015-07-09 – 2015-07-10 (×2): 1 via ORAL
  Filled 2015-07-09 (×2): qty 1

## 2015-07-09 MED ORDER — SENNOSIDES-DOCUSATE SODIUM 8.6-50 MG PO TABS: 2.0000 | ORAL_TABLET | ORAL | Status: DC

## 2015-07-09 MED ORDER — BUPIVACAINE HCL (PF) 0.25 % IJ SOLN
INTRAMUSCULAR | Status: DC | PRN
  Administered 2015-07-09: 3 mL

## 2015-07-09 MED ORDER — OXYCODONE HCL 5 MG/5ML PO SOLN: 5.0000 mg | Freq: Once | ORAL | Status: DC | PRN

## 2015-07-09 MED ORDER — ACETAMINOPHEN 160 MG/5ML PO SOLN: 325.0000 mg | ORAL | Status: DC | PRN

## 2015-07-09 SURGICAL SUPPLY — 26 items
BLADE SURG 11 STRL SS (BLADE) ×2 IMPLANT
CATH ROBINSON RED A/P 16FR (CATHETERS) ×2 IMPLANT
CLIP FILSHIE TUBAL LIGA STRL (Clip) ×1 IMPLANT
CLOTH BEACON ORANGE TIMEOUT ST (SAFETY) ×2 IMPLANT
CONTAINER PREFILL 10% NBF 15ML (MISCELLANEOUS) IMPLANT
DRSG OPSITE POSTOP 3X4 (GAUZE/BANDAGES/DRESSINGS) ×2 IMPLANT
ELECT REM PT RETURN 9FT ADLT (ELECTROSURGICAL) ×2
ELECTRODE REM PT RTRN 9FT ADLT (ELECTROSURGICAL) ×1 IMPLANT
GLOVE BIOGEL PI IND STRL 7.0 (GLOVE) ×1 IMPLANT
GLOVE BIOGEL PI INDICATOR 7.0 (GLOVE) ×1
GLOVE ECLIPSE 7.0 STRL STRAW (GLOVE) ×4 IMPLANT
GOWN STRL REUS W/TWL LRG LVL3 (GOWN DISPOSABLE) ×4 IMPLANT
NEEDLE HYPO 22GX1.5 SAFETY (NEEDLE) IMPLANT
NS IRRIG 1000ML POUR BTL (IV SOLUTION) ×2 IMPLANT
PACK ABDOMINAL MINOR (CUSTOM PROCEDURE TRAY) ×2 IMPLANT
PENCIL BUTTON HOLSTER BLD 10FT (ELECTRODE) ×2 IMPLANT
SPONGE LAP 4X18 X RAY DECT (DISPOSABLE) IMPLANT
SUT MNCRL AB 0 CT1 27 (SUTURE) ×2 IMPLANT
SUT MON AB 2-0 CT1 36 (SUTURE) ×1 IMPLANT
SUT PLAIN 0 NONE (SUTURE) IMPLANT
SUT VIC AB 2-0 CT1 27 (SUTURE) ×2
SUT VIC AB 2-0 CT1 TAPERPNT 27 (SUTURE) IMPLANT
SUT VICRYL RAPIDE 4/0 PS 2 (SUTURE) ×2 IMPLANT
SYR CONTROL 10ML LL (SYRINGE) IMPLANT
TOWEL OR 17X24 6PK STRL BLUE (TOWEL DISPOSABLE) ×4 IMPLANT
WATER STERILE IRR 1000ML POUR (IV SOLUTION) ×1 IMPLANT

## 2015-07-09 NOTE — H&P (Signed)
Pt is a 34 y/o black female, G4P3003 at term who was admitted in labor. PNC was uncomplicated. She is GBS+. She desires a PPBTL PMHX: See hollister PE: VSSAF        HEENT-wnl        ABD- gravid, non tender        FHTs-reactive IMP/ IUP at term         +GBS PLAN/ Admit            Start PCN

## 2015-07-09 NOTE — Anesthesia Preprocedure Evaluation (Signed)
Anesthesia Evaluation  Patient identified by MRN, date of birth, ID band Patient awake    Reviewed: Allergy & Precautions, Patient's Chart, lab work & pertinent test results  History of Anesthesia Complications Negative for: history of anesthetic complications  Airway Mallampati: II  TM Distance: >3 FB Neck ROM: Full    Dental  (+) Teeth Intact   Pulmonary neg pulmonary ROS,    breath sounds clear to auscultation- rhonchi       Cardiovascular negative cardio ROS   Rhythm:Regular     Neuro/Psych negative neurological ROS  negative psych ROS   GI/Hepatic negative GI ROS, Neg liver ROS,   Endo/Other  negative endocrine ROS  Renal/GU negative Renal ROS     Musculoskeletal   Abdominal   Peds  Hematology negative hematology ROS (+)   Anesthesia Other Findings   Reproductive/Obstetrics (+) Pregnancy                             Anesthesia Physical Anesthesia Plan  ASA: II  Anesthesia Plan: Epidural   Post-op Pain Management:    Induction: Intravenous  Airway Management Planned: Nasal Cannula and Simple Face Mask  Additional Equipment: None  Intra-op Plan:   Post-operative Plan:   Informed Consent: I have reviewed the patients History and Physical, chart, labs and discussed the procedure including the risks, benefits and alternatives for the proposed anesthesia with the patient or authorized representative who has indicated his/her understanding and acceptance.   Dental advisory given  Plan Discussed with: CRNA and Surgeon  Anesthesia Plan Comments:         Anesthesia Quick Evaluation

## 2015-07-09 NOTE — Progress Notes (Signed)
Dr Harrington Challenger notified of pt's admission and status. Aware of ctx pattern, variable, pos scalp stim., FHR reactive but  minimal variability currently currtnly. Will admit to BS.

## 2015-07-09 NOTE — Anesthesia Postprocedure Evaluation (Signed)
Anesthesia Post Note  Patient: Cassandra Vargas  Procedure(s) Performed: Procedure(s) (LRB): POST PARTUM TUBAL LIGATION (Bilateral)  Patient location during evaluation: PACU Anesthesia Type: General Level of consciousness: awake and alert Pain management: pain level controlled Vital Signs Assessment: post-procedure vital signs reviewed and stable Respiratory status: spontaneous breathing Cardiovascular status: stable Postop Assessment: No signs of nausea or vomiting Anesthetic complications: no    Last Vitals:  Filed Vitals:   07/09/15 1640 07/09/15 2013  BP: 105/62 110/62  Pulse: 80 82  Temp: 37.1 C 37.2 C  Resp: 20 18    Last Pain:  Filed Vitals:   07/09/15 2019  PainSc: 0-No pain                 Martisha Toulouse

## 2015-07-09 NOTE — OR Nursing (Signed)
Pt arrived to pacu at 14:30. [employer]. Admits pt . Assesses pt. 14;30 116/69,72,14,98.1 dressing to umbilicus cdi, fundus u/e scant vaginal, paim score 1, 14:45 116/77,67,14,97% room air. Darin Engels Rn arrives at 14:55 to take over care of pt. Darin Engels rn

## 2015-07-09 NOTE — MAU Note (Signed)
Contractions for an hour. Denies LOF or bleeding. 2.5cm last exam

## 2015-07-09 NOTE — Anesthesia Preprocedure Evaluation (Signed)
Anesthesia Evaluation  Patient identified by MRN, date of birth, ID band Patient awake    Reviewed: Allergy & Precautions, NPO status , Patient's Chart, lab work & pertinent test results  Airway Mallampati: II  TM Distance: >3 FB Neck ROM: Full    Dental no notable dental hx.    Pulmonary neg pulmonary ROS,    Pulmonary exam normal breath sounds clear to auscultation       Cardiovascular negative cardio ROS Normal cardiovascular exam Rhythm:Regular Rate:Normal     Neuro/Psych negative neurological ROS  negative psych ROS   GI/Hepatic negative GI ROS, Neg liver ROS,   Endo/Other  negative endocrine ROS  Renal/GU negative Renal ROS  negative genitourinary   Musculoskeletal negative musculoskeletal ROS (+)   Abdominal   Peds negative pediatric ROS (+)  Hematology negative hematology ROS (+)   Anesthesia Other Findings   Reproductive/Obstetrics negative OB ROS                             Anesthesia Physical Anesthesia Plan  ASA: II  Anesthesia Plan: Epidural   Post-op Pain Management:    Induction:   Airway Management Planned:   Additional Equipment:   Intra-op Plan:   Post-operative Plan:   Informed Consent: I have reviewed the patients History and Physical, chart, labs and discussed the procedure including the risks, benefits and alternatives for the proposed anesthesia with the patient or authorized representative who has indicated his/her understanding and acceptance.   Dental advisory given  Plan Discussed with: Anesthesiologist  Anesthesia Plan Comments:         Anesthesia Quick Evaluation

## 2015-07-09 NOTE — Progress Notes (Signed)
Transported Cassandra Vargas to nursery after VS and measurements taken. Mother to OR for tubal and father left to pick up other siblings.

## 2015-07-09 NOTE — Anesthesia Procedure Notes (Signed)
Epidural Patient location during procedure: OB  Staffing Anesthesiologist: Merida Alcantar Performed by: anesthesiologist   Preanesthetic Checklist Completed: patient identified, surgical consent, pre-op evaluation, timeout performed, IV checked, risks and benefits discussed and monitors and equipment checked  Epidural Patient position: sitting Prep: DuraPrep Patient monitoring: heart rate, cardiac monitor, continuous pulse ox and blood pressure Approach: midline Location: L3-L4 Injection technique: LOR saline  Needle:  Needle type: Tuohy  Needle gauge: 17 G Needle length: 9 cm Needle insertion depth: 6 cm Catheter type: closed end flexible Catheter size: 19 Gauge Catheter at skin depth: 12 cm Test dose: negative and 2% lidocaine with Epi 1:200 K  Assessment Events: blood not aspirated, injection not painful, no injection resistance, negative IV test and no paresthesia  Additional Notes Reason for block:procedure for pain   

## 2015-07-09 NOTE — Transfer of Care (Signed)
Immediate Anesthesia Transfer of Care Note  Patient: Cassandra Vargas  Procedure(s) Performed: Procedure(s): POST PARTUM TUBAL LIGATION (Bilateral)  Patient Location: PACU  Anesthesia Type:Epidural  Level of Consciousness: awake and alert   Airway & Oxygen Therapy: Patient Spontanous Breathing  Post-op Assessment: Report given to RN and Post -op Vital signs reviewed and stable  Post vital signs: Reviewed and stable  Last Vitals:  Filed Vitals:   07/09/15 1342 07/09/15 1344  BP: 119/84 106/82  Pulse: 72 81  Temp:    Resp: 18 18    Complications: No apparent anesthesia complications

## 2015-07-09 NOTE — MAU Note (Signed)
Report called to [facility] RN in BS. Pt may go to 163

## 2015-07-10 MED ORDER — OXYCODONE-ACETAMINOPHEN 5-325 MG PO TABS: 1.0000 | ORAL_TABLET | ORAL | Status: DC | PRN

## 2015-07-10 NOTE — Anesthesia Postprocedure Evaluation (Signed)
  Anesthesia Post-op Note  Patient: Cassandra Vargas  Procedure(s) Performed: * No procedures listed *  Patient Location: Mother/Baby  Anesthesia Type:Epidural  Level of Consciousness: awake, alert , oriented and patient cooperative  Airway and Oxygen Therapy: Patient Spontanous Breathing  Post-op Pain: none  Post-op Assessment: Post-op Vital signs reviewed, Patient's Cardiovascular Status Stable, Respiratory Function Stable, Patent Airway, No headache, No backache and Patient able to bend at knees              Post-op Vital Signs: Reviewed and stable  Last Vitals:  Filed Vitals:   07/09/15 2327 07/10/15 0420  BP: 108/54 104/62  Pulse: 78 64  Temp: 36.7 C 36.6 C  Resp: 20 18    Complications: No apparent anesthesia complications

## 2015-07-10 NOTE — Addendum Note (Signed)
Addendum  created 07/10/15 0818 by Raenette Rover, CRNA   Modules edited: Notes Section   Notes Section:  File: [id number]

## 2015-07-10 NOTE — Anesthesia Postprocedure Evaluation (Signed)
  Anesthesia Post-op Note  Patient: Cassandra Vargas  Procedure(s) Performed: Procedure(s): POST PARTUM TUBAL LIGATION (Bilateral)  Patient Location: Mother/Baby  Anesthesia Type:Epidural  Level of Consciousness: awake, alert , oriented and patient cooperative  Airway and Oxygen Therapy: Patient Spontanous Breathing  Post-op Pain: none  Post-op Assessment: Post-op Vital signs reviewed, Patient's Cardiovascular Status Stable, Respiratory Function Stable, Patent Airway, No headache, No backache and Patient able to bend at knees              Post-op Vital Signs: Reviewed and stable  Last Vitals:  Filed Vitals:   07/09/15 2327 07/10/15 0420  BP: 108/54 104/62  Pulse: 78 64  Temp: 36.7 C 36.6 C  Resp: 20 18    Complications: No apparent anesthesia complications

## 2015-07-10 NOTE — Lactation Note (Signed)
This note was copied from the chart of Cassandra Erienne Wegner. Lactation Consultation Note  Patient Name: Cassandra Vargas [id number] Date: 07/10/2015 Reason for consult: Follow-up assessment Per Mom she is planning to BF some feedings and pump/bottle feed some feedings. At this visit baby recently BF and had supplement so unable to observe latch. Mom does not have DEBP at home, planning to purchase pump from [place]. Discussed the importance of having good DEBP to be successful if she does mostly pump/bottle feed as she did with her last child. Gave Mom WIC number to call today regarding pump as well. Stressed to [employer] importance of stimulation to breast at least every 3 hours. Discussed nipple confusion with BR/BO this early. Mom to call with next feeding for latch check, LC left phone number for Mom to call.   Maternal Data    Feeding Feeding Type: Formula Length of feed: 6 min  LATCH Score/Interventions Latch: Repeated attempts needed to sustain latch, nipple held in mouth throughout feeding, stimulation needed to elicit sucking reflex. Intervention(s): Assist with latch  Audible Swallowing: A few with stimulation Intervention(s): Skin to skin;Hand expression Intervention(s): Skin to skin;Hand expression  Type of Nipple: Everted at rest and after stimulation  Comfort (Breast/Nipple): Soft / non-tender     Hold (Positioning): Assistance needed to correctly position infant at breast and maintain latch.  LATCH Score: 7  Lactation Tools Discussed/Used Tools: Pump Breast pump type: Double-Electric Breast Pump WIC Program: Yes   Consult Status Consult Status: Follow-up Date: 07/10/15 Follow-up type: In-patient    Katrine Coho 07/10/2015, 11:44 AM

## 2015-07-10 NOTE — Op Note (Signed)
Cassandra Vargas, LANTHIER NO.:  [id number]  MEDICAL RECORD NO.:  [id number]  LOCATION:  [id number]                          FACILITY:  [facility]  PHYSICIAN:  Freda Munro, M.D.    DATE OF BIRTH:  [birth year]  DATE OF PROCEDURE:  07/09/2015 DATE OF DISCHARGE:                              OPERATIVE REPORT   PREOPERATIVE DIAGNOSIS:  Patient desires permanent sterilization.  POSTOPERATIVE DIAGNOSIS:  Patient desires permanent sterilization.  PROCEDURE:  Postpartum bilateral tubal ligation with Filshie clips.  SURGEON:  Freda Munro, M.D.  ANESTHESIA:  Epidural.  ANTIBIOTICS:  Ancef 2 g.  DRAINS:  Foley bedside drainage.  ESTIMATED BLOOD LOSS:  Minimal.  SPECIMENS:  None.  FINDINGS:  The patient had normal fallopian tubes bilaterally.  PROCEDURE IN DETAIL:  The patient was taken to the operating room, where epidural anesthetic was reinjected.  Once an adequate level was reached, the patient was prepped and draped in usual fashion for this procedure. A Foley catheter was placed in her bladder.  Her umbilicus was then injected with 0.25% Marcaine.  A 2-cm infraumbilical incision was made. The fascia was grasped, the fascia was entered with the Metzenbaum scissors.  The parietal peritoneum was entered sharply.  The patient was then placed in Trendelenburg.  The [employer] retractors were then placed.  The right fallopian tube was then grasped with a Babcock and carried to its fimbriated end for identification.  A Filshie clip was then placed in the isthmic portion.  The clip was placed perpendicular to the tube.  The entire tube appeared to be within the clasp.  The clasp appeared to be tightly closed.  At this point, a similar procedure was performed on the opposite side.  Following this, the instruments were removed.  The parietal peritoneum and fascia were closed using 2-0 Monocryl in a running fashion.  The skin was closed using 4-0 Rapide in a subcuticular fashion.   The patient tolerated the procedure well.  She was taken to recovery room in stable condition.  Instrument and lap counts correct x3.          ______________________________ Freda Munro, M.D.     MA/MEDQ  D:  07/09/2015  T:  07/09/2015  Job:  [id number]

## 2015-07-10 NOTE — Discharge Summary (Signed)
Obstetric Discharge Summary Reason for Admission: onset of labor Prenatal Procedures: ultrasound Intrapartum Procedures: spontaneous vaginal delivery Postpartum Procedures: P.P. tubal ligation Complications-Operative and Postpartum: none HEMOGLOBIN  Date Value Ref Range Status  07/09/2015 9.9* 12.0 - 15.0 g/dL Final   HCT  Date Value Ref Range Status  07/09/2015 31.0* 36.0 - 46.0 % Final    Physical Exam:  General: alert and cooperative Lochia: appropriate Uterine Fundus: firm Incision: healing well, no significant drainage, no significant erythema DVT Evaluation: No evidence of DVT seen on physical exam.  Discharge Diagnoses: Term Pregnancy-delivered  Discharge Information: Date: 07/10/2015 Activity: pelvic rest Diet: routine Medications: PNV, Ibuprofen and Percocet Condition: stable Instructions: refer to practice specific booklet Discharge to: home Follow-up Information    Follow up with Olga Millers, MD In 4 weeks.   Specialty:  Obstetrics and Gynecology   Contact information:   [address] [place] [id number] [phone]       Newborn Data: Live born female  Birth Weight: 7 lb 9 oz (3430 g) APGAR: 8, 9  Home with mother, baby may stay add'l day  Allyn Kenner 07/10/2015, 10:11 AM

## 2015-07-10 NOTE — Lactation Note (Signed)
This note was copied from the chart of Cassandra Vargas. Lactation Consultation Note Mom chooses to pump and bottle feed. She doesn't like the baby on the breast. Has BF once and bottle fed from here on. Hand expression and breast massage taught collect 11ml thick colostrum. Has long pendulum breast w/everted nipples. Rt. Nipple extra large, I;m not sure baby could latch to that nipple or not. #30 flange given for Rt. Nipple. Lt. Nipple #27 fits well. Mom shown how to use DEBP & how to disassemble, clean, & reassemble parts.Mom knows to pump q3h for 15-20 min. Mom encouraged to feed baby 8-12 times/24 hours and with feeding cues. Mom encouraged to waken baby for feeds. Educated about newborn behavior, I&O, supply and demand. Mom encouraged to do skin-to-skin. [place] brochure given w/resources, support groups and [place] services. Mom has 50 yr old that she mainly pumped and bottle fed for 6 weeks. Plans on purchasing DEBP when she gets home. Has WIC. Patient Name: Cassandra Argusta Mikkelsen [id number] Date: 07/10/2015 Reason for consult: Initial assessment   Maternal Data Has patient been taught Hand Expression?: Yes Does the patient have breastfeeding experience prior to this delivery?: Yes  Feeding Feeding Type: Breast Fed  LATCH Score/Interventions Latch: Repeated attempts needed to sustain latch, nipple held in mouth throughout feeding, stimulation needed to elicit sucking reflex.  Audible Swallowing: A few with stimulation  Type of Nipple: Everted at rest and after stimulation (Rt. nipple extra large)  Comfort (Breast/Nipple): Soft / non-tender     Hold (Positioning): Assistance needed to correctly position infant at breast and maintain latch.  LATCH Score: 7  Lactation Tools Discussed/Used Tools: Pump;Flanges Flange Size: 30 Breast pump type: Double-Electric Breast Pump WIC Program: Yes Pump Review: Setup, frequency, and cleaning;Milk Storage Initiated by:: Allayne Stack RN Date initiated::  07/10/15   Consult Status Consult Status: Follow-up Date: 07/11/15 Follow-up type: In-patient    Evan Mackie, Elta Guadeloupe 07/10/2015, 2:17 AM

## 2015-07-11 ENCOUNTER — Encounter (HOSPITAL_COMMUNITY): Payer: Self-pay | Admitting: Obstetrics and Gynecology

## 2015-07-11 MED ORDER — IBUPROFEN 600 MG PO TABS: 600.0000 mg | ORAL_TABLET | Freq: Four times a day (QID) | ORAL | Status: DC | PRN

## 2015-07-11 MED ORDER — OXYCODONE-ACETAMINOPHEN 5-325 MG PO TABS: 1.0000 | ORAL_TABLET | ORAL | Status: DC | PRN

## 2015-07-11 MED ORDER — DOCUSATE SODIUM 100 MG PO CAPS: 100.0000 mg | ORAL_CAPSULE | Freq: Two times a day (BID) | ORAL | Status: DC

## 2015-07-11 NOTE — Lactation Note (Signed)
This note was copied from the chart of Cassandra Jaslynn Remsen. Lactation Consultation Note  Discussed various type of pumps. It is best to pump w/ DEBP.  Provided mother w/ manual breastpump. Encouarged her to breastfeed 8-12 times a day.  Mother states baby is latching well. If she decided to pump explained she needs to pump approx every 3 hours. Reviewed engorgement care and monitoring voids/stools.   Patient Name: Cassandra Vargas [id number] Date: 07/11/2015 Reason for consult: Follow-up assessment   Maternal Data    Feeding Feeding Type: Bottle Fed - Formula Nipple Type: Slow - flow  LATCH Score/Interventions                      Lactation Tools Discussed/Used     Consult Status Consult Status: Complete    Carlye Grippe 07/11/2015, 8:57 AM

## 2015-10-11 ENCOUNTER — Emergency Department (HOSPITAL_COMMUNITY)
Admission: EM | Admit: 2015-10-11 | Discharge: 2015-10-11 | Disposition: A | Discharge status: Home / Self Care | Payer: Medicaid Other | Attending: Emergency Medicine | Admitting: Emergency Medicine

## 2015-10-11 ENCOUNTER — Encounter (HOSPITAL_COMMUNITY): Payer: Self-pay | Admitting: Emergency Medicine

## 2015-10-11 ENCOUNTER — Emergency Department (HOSPITAL_COMMUNITY): Payer: Medicaid Other

## 2015-10-11 DIAGNOSIS — J111 Influenza due to unidentified influenza virus with other respiratory manifestations: Secondary | ICD-10-CM | POA: Diagnosis not present

## 2015-10-11 DIAGNOSIS — R509 Fever, unspecified: Secondary | ICD-10-CM | POA: Diagnosis present

## 2015-10-11 DIAGNOSIS — Z79899 Other long term (current) drug therapy: Secondary | ICD-10-CM | POA: Insufficient documentation

## 2015-10-11 DIAGNOSIS — R000 Tachycardia, unspecified: Secondary | ICD-10-CM | POA: Insufficient documentation

## 2015-10-11 DIAGNOSIS — R69 Illness, unspecified: Secondary | ICD-10-CM

## 2015-10-11 LAB — COMPREHENSIVE METABOLIC PANEL
ALK PHOS: 59 U/L (ref 38–126)
ALT: 10 U/L — AB (ref 14–54)
ANION GAP: 12 (ref 5–15)
AST: 16 U/L (ref 15–41)
Albumin: 3.9 g/dL (ref 3.5–5.0)
BUN: <5 mg/dL — ABNORMAL LOW (ref 6–20)
CALCIUM: 9.5 mg/dL (ref 8.9–10.3)
CHLORIDE: 102 mmol/L (ref 101–111)
CO2: 23 mmol/L (ref 22–32)
CREATININE: 0.75 mg/dL (ref 0.44–1.00)
GFR calc Af Amer: >60 mL/min (ref >60)
GFR calc non Af Amer: >60 mL/min (ref >60)
GLUCOSE: 101 mg/dL — AB (ref 65–99)
Potassium: 3.6 mmol/L (ref 3.5–5.1)
Sodium: 137 mmol/L (ref 135–145)
Total Bilirubin: 0.6 mg/dL (ref 0.3–1.2)
Total Protein: 7.4 g/dL (ref 6.5–8.1)

## 2015-10-11 LAB — URINALYSIS, ROUTINE W REFLEX MICROSCOPIC
Bilirubin Urine: NEGATIVE
GLUCOSE, UA: NEGATIVE mg/dL
Hgb urine dipstick: NEGATIVE
KETONES UR: NEGATIVE mg/dL
LEUKOCYTES UA: NEGATIVE
Nitrite: NEGATIVE
PH: 7 (ref 5.0–8.0)
Protein, ur: NEGATIVE mg/dL
SPECIFIC GRAVITY, URINE: 1.007 (ref 1.005–1.030)

## 2015-10-11 LAB — CBC WITH DIFFERENTIAL/PLATELET
Basophils Absolute: 0 10*3/uL (ref 0.0–0.1)
Basophils Relative: 0 %
Eosinophils Absolute: 0 10*3/uL (ref 0.0–0.7)
Eosinophils Relative: 0 %
HEMATOCRIT: 31.6 % — AB (ref 36.0–46.0)
HEMOGLOBIN: 10.2 g/dL — AB (ref 12.0–15.0)
LYMPHS ABS: 0.5 10*3/uL — AB (ref 0.7–4.0)
LYMPHS PCT: 9 %
MCH: 26.8 pg (ref 26.0–34.0)
MCHC: 32.3 g/dL (ref 30.0–36.0)
MCV: 83.2 fL (ref 78.0–100.0)
MONOS PCT: 6 %
Monocytes Absolute: 0.3 10*3/uL (ref 0.1–1.0)
NEUTROS ABS: 4.2 10*3/uL (ref 1.7–7.7)
Neutrophils Relative %: 85 %
Platelets: 178 10*3/uL (ref 150–400)
RBC: 3.8 MIL/uL — AB (ref 3.87–5.11)
RDW: 15.2 % (ref 11.5–15.5)
WBC: 5 10*3/uL (ref 4.0–10.5)

## 2015-10-11 LAB — I-STAT CG4 LACTIC ACID, ED
Lactic Acid, Venous: 1.05 mmol/L (ref 0.5–2.0)
Lactic Acid, Venous: 0.49 mmol/L — ABNORMAL LOW (ref 0.5–2.0)

## 2015-10-11 LAB — TROPONIN I

## 2015-10-11 MED ORDER — VANCOMYCIN HCL IN DEXTROSE 1-5 GM/200ML-% IV SOLN
1000.0000 mg | Freq: Once | INTRAVENOUS | Status: DC
  Filled 2015-10-11: qty 200

## 2015-10-11 MED ORDER — PIPERACILLIN-TAZOBACTAM 3.375 G IVPB 30 MIN
3.3750 g | Freq: Once | INTRAVENOUS | Status: AC
  Administered 2015-10-11: 3.375 g via INTRAVENOUS
  Filled 2015-10-11: qty 50

## 2015-10-11 MED ORDER — SODIUM CHLORIDE 0.9 % IV BOLUS (SEPSIS)
1000.0000 mL | INTRAVENOUS | Status: AC
  Administered 2015-10-11 (×2): 1000 mL via INTRAVENOUS

## 2015-10-11 MED ORDER — PIPERACILLIN-TAZOBACTAM 3.375 G IVPB: 3.3750 g | Freq: Three times a day (TID) | INTRAVENOUS | Status: DC

## 2015-10-11 MED ORDER — BENZONATATE 100 MG PO CAPS: 100.0000 mg | ORAL_CAPSULE | Freq: Three times a day (TID) | ORAL | Status: DC

## 2015-10-11 MED ORDER — VANCOMYCIN HCL IN DEXTROSE 1-5 GM/200ML-% IV SOLN: 1000.0000 mg | Freq: Three times a day (TID) | INTRAVENOUS | Status: DC

## 2015-10-11 MED ORDER — ACETAMINOPHEN 325 MG PO TABS
650.0000 mg | ORAL_TABLET | Freq: Once | ORAL | Status: AC | PRN
  Administered 2015-10-11: 650 mg via ORAL
  Filled 2015-10-11: qty 2

## 2015-10-11 MED ORDER — SODIUM CHLORIDE 0.9 % IV BOLUS (SEPSIS)
500.0000 mL | INTRAVENOUS | Status: AC
  Administered 2015-10-11: 500 mL via INTRAVENOUS

## 2015-10-11 MED ORDER — VANCOMYCIN HCL 10 G IV SOLR
1500.0000 mg | Freq: Once | INTRAVENOUS | Status: AC
  Administered 2015-10-11: 1500 mg via INTRAVENOUS
  Filled 2015-10-11: qty 1500

## 2015-10-11 MED ORDER — OSELTAMIVIR PHOSPHATE 75 MG PO CAPS: 75.0000 mg | ORAL_CAPSULE | Freq: Two times a day (BID) | ORAL | Status: DC

## 2015-10-11 NOTE — Discharge Instructions (Signed)

## 2015-10-11 NOTE — ED Notes (Signed)
Pt from home for eval of cough, generalized body aches x3 days with sob that started this morning, pt febrile in triage and tachycardic. Lung sounds clear, denies any n/v/d or cp at this time.

## 2015-10-11 NOTE — ED Notes (Signed)
Pt remains monitored by blood pressure, pulse ox, and 5 lead.  

## 2015-10-11 NOTE — Consult Note (Signed)
Pharmacy Antibiotic Note  Cassandra Vargas is a 35 y.o. female admitted on 10/11/2015 with sepsis.  Pharmacy has been consulted for vanc/zosyn dosing.  Pt presented w/ cough, generalized body aches for 3 days, and SOB that started this AM. She is febrile to 102.8, tachycardic, and hypotensive. Code Sepsis called.  Plan: Vanc 1500 mg IV x1, then 1 g IV q8h Zosyn 3.375 g IV q8h Monitor renal function, VT prn, cultures, LOT  Height: 5\' 4"  (162.6 cm) Weight: 178 lb 2.1 oz (80.8 kg) IBW/kg (Calculated) : 54.7  Temp (24hrs), Avg:101.9 F (38.8 C), Min:100.9 F (38.3 C), Max:102.8 F (39.3 C)   Recent Labs Lab 10/11/15 1202 10/11/15 1205  CREATININE  --  0.75  LATICACIDVEN 1.05  --     Estimated Creatinine Clearance: 103.8 mL/min (by C-G formula based on Cr of 0.75).    No Known Allergies  Antimicrobials this admission: Vanc 2/22 >>  Zosyn 2/22 >>   Dose adjustments this admission: n/a  Microbiology results: 2/22 BCx:  2/22 UCx:    Thank you for allowing pharmacy to be a part of this patient's care.  Joya San, PharmD Clinical Pharmacy Resident Pager # [phone] 10/11/2015 2:17 PM   Pharmacy Code Sepsis Protocol  Time of code sepsis page: 13:05 [x]  Antibiotics delivered at 13:24 (vanc only) Zosyn pulled and given prior to vanc delivery # 1310  Were antibiotics ordered at the time of the code sepsis page? Yes Was it required to contact the physician? [x]  Physician not contacted []  Physician contacted to order antibiotics for code sepsis []  Physician contacted to recommend changing antibiotics  Pharmacy consulted for: vanc/zosyn  Anti-infectives    Start     Dose/Rate Route Frequency Ordered Stop   10/11/15 2230  vancomycin (VANCOCIN) IVPB 1000 mg/200 mL premix     1,000 mg 200 mL/hr over 60 Minutes Intravenous Every 8 hours 10/11/15 1417     10/11/15 2115  piperacillin-tazobactam (ZOSYN) IVPB 3.375 g     3.375 g 12.5 mL/hr over 240 Minutes Intravenous  Every 8 hours 10/11/15 1343     10/11/15 1330  vancomycin (VANCOCIN) 1,500 mg in sodium chloride 0.9 % 500 mL IVPB     1,500 mg 250 mL/hr over 120 Minutes Intravenous  Once 10/11/15 1320     10/11/15 1300  piperacillin-tazobactam (ZOSYN) IVPB 3.375 g     3.375 g 100 mL/hr over 30 Minutes Intravenous  Once 10/11/15 1245 10/11/15 1340   10/11/15 1300  vancomycin (VANCOCIN) IVPB 1000 mg/200 mL premix  Status:  Discontinued     1,000 mg 200 mL/hr over 60 Minutes Intravenous  Once 10/11/15 1245 10/11/15 1320        Nurse education provided: []  Minutes left to administer antibiotics to achieve 1 hour goal []  Correct order of antibiotic administration []  Antibiotic Y-site compatibilities     Joya San, PharmD Clinical Pharmacy Resident Pager # [phone] 10/11/2015 2:17 PM

## 2015-10-11 NOTE — ED Provider Notes (Signed)
CSN: [id number]     Arrival date & time 10/11/15  1111 History   First MD Initiated Contact with Patient 10/11/15 1224     Chief Complaint  Patient presents with  . Fever  . Tachycardia    Patient is a 35 y.o. female presenting with cough.  Cough Cough characteristics:  Non-productive Severity:  Moderate Onset quality:  Sudden Duration:  1 day Timing:  Intermittent Progression:  Worsening Chronicity:  New Relieved by:  Nothing Ineffective treatments: OTC meds. Associated symptoms: fever, rhinorrhea and shortness of breath   Associated symptoms: no diaphoresis, no headaches, no myalgias, no rash and no sore throat   Pt's daughter has been sick.  No foreign travel. Pt did have her vaccinations.  NO dysuria.   No vomiting or diarrhea.  No pain anywhere.  NO flu vacc this year.  Past Medical History  Diagnosis Date  . Medical history non-contributory    Past Surgical History  Procedure Laterality Date  . No past surgeries    . Tubal ligation Bilateral 07/09/2015    Procedure: POST PARTUM TUBAL LIGATION;  Surgeon: Olga Millers, MD;  Location: [facility];  Service: Gynecology;  Laterality: Bilateral;   Family History  Problem Relation Age of Onset  . Alcohol abuse Neg Hx    Social History  Substance Use Topics  . Smoking status: Never Smoker   . Smokeless tobacco: None  . Alcohol Use: No   OB History    Gravida Para Term Preterm AB TAB SAB Ectopic Multiple Living   4 4 4  0 0 0 0 0 0 4     Review of Systems  Constitutional: Positive for fever. Negative for diaphoresis.  HENT: Positive for rhinorrhea. Negative for sore throat.   Respiratory: Positive for cough and shortness of breath.   Musculoskeletal: Negative for myalgias.  Skin: Negative for rash.  Neurological: Positive for light-headedness. Negative for headaches.  All other systems reviewed and are negative.     Allergies  Review of patient's allergies indicates no known allergies.  Home Medications    Prior to Admission medications   Medication Sig Start Date End Date Taking? Authorizing Provider  Diphenhydramine-PE-APAP (THERAFLU WARMING RELIEF FLU) 12.5-5-325 MG/15ML LIQD Take 15 mLs by mouth 2 (two) times daily.   Yes Historical Provider, MD  benzonatate (TESSALON) 100 MG capsule Take 1 capsule (100 mg total) by mouth every 8 (eight) hours. 10/11/15   Dorie Rank, MD  docusate sodium (COLACE) 100 MG capsule Take 1 capsule (100 mg total) by mouth 2 (two) times daily. Patient not taking: Reported on 10/11/2015 07/11/15   Vanessa Kick, MD  ibuprofen (ADVIL,MOTRIN) 600 MG tablet Take 1 tablet (600 mg total) by mouth every 6 (six) hours as needed. Patient not taking: Reported on 10/11/2015 07/11/15   Vanessa Kick, MD  oseltamivir (TAMIFLU) 75 MG capsule Take 1 capsule (75 mg total) by mouth 2 (two) times daily. 10/11/15   Dorie Rank, MD  oxyCODONE-acetaminophen (PERCOCET/ROXICET) 5-325 MG tablet Take 1 tablet by mouth every 4 (four) hours as needed (for pain scale 4-7). Patient not taking: Reported on 10/11/2015 07/10/15   Allyn Kenner, DO  oxyCODONE-acetaminophen (ROXICET) 5-325 MG tablet Take 1-2 tablets by mouth every 4 (four) hours as needed for severe pain. Patient not taking: Reported on 10/11/2015 07/11/15   Vanessa Kick, MD   BP 102/55 mmHg  Pulse 108  Temp(Src) 101.3 F (38.5 C) (Oral)  Resp 16  Ht 5\' 4"  (1.626 m)  Wt 80.8 kg  BMI 30.56 kg/m2  SpO2 97%  LMP 10/02/2015 Physical Exam  Constitutional: She appears well-developed and well-nourished. No distress.  HENT:  Head: Normocephalic and atraumatic.  Right Ear: External ear normal.  Left Ear: External ear normal.  Mouth/Throat: No oropharyngeal exudate.  Eyes: Conjunctivae are normal. Pupils are equal, round, and reactive to light. Right eye exhibits no discharge. Left eye exhibits no discharge. No scleral icterus.  Neck: Normal range of motion. Neck supple. No tracheal deviation present.  Cardiovascular: Regular rhythm and  intact distal pulses.   Extrasystoles are present. Tachycardia present.   Pulmonary/Chest: Effort normal and breath sounds normal. No stridor. No respiratory distress. She has no wheezes. She has no rales.  Abdominal: Soft. Bowel sounds are normal. She exhibits no distension. There is no tenderness. There is no rebound and no guarding.  Musculoskeletal: She exhibits no edema or tenderness.  Neurological: She is alert. She has normal strength. No cranial nerve deficit (no facial droop, extraocular movements intact, no slurred speech) or sensory deficit. She exhibits normal muscle tone. She displays no seizure activity. Coordination normal.  Skin: Skin is warm and dry. No rash noted.  Psychiatric: She has a normal mood and affect.  Nursing note and vitals reviewed.   ED Course  Procedures (including critical care time) Labs Review Labs Reviewed  COMPREHENSIVE METABOLIC PANEL - Abnormal; Notable for the following:    Glucose, Bld 101 (*)    BUN <5 (*)    ALT 10 (*)    All other components within normal limits  CBC WITH DIFFERENTIAL/PLATELET - Abnormal; Notable for the following:    RBC 3.80 (*)    Hemoglobin 10.2 (*)    HCT 31.6 (*)    Lymphs Abs 0.5 (*)    All other components within normal limits  I-STAT CG4 LACTIC ACID, ED - Abnormal; Notable for the following:    Lactic Acid, Venous 0.49 (*)    All other components within normal limits  CULTURE, BLOOD (ROUTINE X 2)  CULTURE, BLOOD (ROUTINE X 2)  URINE CULTURE  URINALYSIS, ROUTINE W REFLEX MICROSCOPIC (NOT AT [facility])  TROPONIN I  CBC WITH DIFFERENTIAL/PLATELET  I-STAT CG4 LACTIC ACID, ED  I-STAT CG4 LACTIC ACID, ED    Imaging Review Dg Chest 2 View  10/11/2015  CLINICAL DATA:  35 year old female with nonproductive cough for 2 days, shortness of breath and congestion. Initial encounter. EXAM: CHEST  2 VIEW COMPARISON:  None. FINDINGS: Normal lung volumes. Normal cardiac size and mediastinal contours. Visualized tracheal air  column is within normal limits. The lungs are clear. No pneumothorax or effusion. Negative visualized bowel gas and osseous structures. IMPRESSION: Negative, no acute cardiopulmonary abnormality. Electronically Signed   By: Genevie Ann M.D.   On: 10/11/2015 12:27   I have personally reviewed and evaluated these images and lab results as part of my medical decision-making.   EKG Interpretation   Date/Time:  Wednesday October 11 2015 11:40:35 EST Ventricular Rate:  131 PR Interval:  128 QRS Duration: 80 QT Interval:  278 QTC Calculation: 410 R Axis:   100 Text Interpretation:  Sinus tachycardia Rightward axis T wave abnormality,  consider inferior ischemia T wave abnormality, consider anterolateral  ischemia Abnormal ECG No old tracing to compare Confirmed by Rhilynn Preyer  MD-J,  Undra Harriman [id number]) on 10/11/2015 1:47:55 PM      MDM   Final diagnoses:  Influenza-like illness    Patient's laboratory tests do not show evidence of urinary tract infection. She has a stable anemia.  No dehydration.  Lactic acid levels are declining.  Chest x-ray does not show evidence of pneumonia.  The patient's tachycardia has improved.  Blood pressure is also improved with IV fluids. I doubt sepsis or systemic bacterial infection. I suspect her symptoms are related to an influenza-like illness. Plan on discharge home with Tamiflu and Tessalon. Follow up with primary doctor in a few days to make sure she is improving. Warning signs and precautions were discussed. Patient is comfortable with discharge and outpatient follow-up.    Dorie Rank, MD 10/11/15 [phone]

## 2015-10-12 LAB — URINE CULTURE

## 2015-10-16 LAB — CULTURE, BLOOD (ROUTINE X 2): Culture: NO GROWTH

## 2015-12-13 ENCOUNTER — Encounter (HOSPITAL_COMMUNITY): Payer: Self-pay | Admitting: Emergency Medicine

## 2015-12-13 ENCOUNTER — Ambulatory Visit (HOSPITAL_COMMUNITY)
Admission: EM | Admit: 2015-12-13 | Discharge: 2015-12-13 | Disposition: A | Discharge status: Home / Self Care | Payer: Medicaid Other | Attending: Emergency Medicine | Admitting: Emergency Medicine

## 2015-12-13 DIAGNOSIS — F410 Panic disorder [episodic paroxysmal anxiety] without agoraphobia: Secondary | ICD-10-CM

## 2015-12-13 DIAGNOSIS — F418 Other specified anxiety disorders: Secondary | ICD-10-CM

## 2015-12-13 MED ORDER — PROPRANOLOL HCL 10 MG PO TABS: ORAL_TABLET | ORAL | Status: DC

## 2015-12-13 NOTE — ED Notes (Signed)
Pt states that she has been experiencing dizziness, shaking, and her heart racing every time she starts driving.  This started on Monday.  She states the feelings come and go the entire time she is in the car and go away as soon as she gets out.  She says she gets the same feelings but not as intense if she is a passenger in the vehicle.  She denies any recent trauma or personal issues.

## 2015-12-13 NOTE — ED Provider Notes (Signed)
CSN: [id number]     Arrival date & time 12/13/15  1322 History   First MD Initiated Contact with Patient 12/13/15 1441     Chief Complaint  Patient presents with  . Panic Attack   (Consider location/radiation/quality/duration/timing/severity/associated sxs/prior Treatment) HPI Comments: 35 year old female complaining of what she believes to be symptoms of a panic attack while driving or riding in a car. This only started 2 days ago. He does occurred every time she has been in the car. Symptoms include some dizziness, lightheadedness increase in heart rate palpitations and feeling nervous. When she leaves the car the symptoms abate. She cannot recall any particular event in her lifetime in which she has had a negative experience while in the vehicle. She does not work outside of the home. She has 4 children and she is taking classes on line.   Past Medical History  Diagnosis Date  . Medical history non-contributory    Past Surgical History  Procedure Laterality Date  . No past surgeries    . Tubal ligation Bilateral 07/09/2015    Procedure: POST PARTUM TUBAL LIGATION;  Surgeon: Olga Millers, MD;  Location: [facility];  Service: Gynecology;  Laterality: Bilateral;   Family History  Problem Relation Age of Onset  . Alcohol abuse Neg Hx    Social History  Substance Use Topics  . Smoking status: Never Smoker   . Smokeless tobacco: None  . Alcohol Use: No   OB History    Gravida Para Term Preterm AB TAB SAB Ectopic Multiple Living   4 4 4  0 0 0 0 0 0 4     Review of Systems  Constitutional: Negative.   HENT: Negative.   Respiratory: Negative.   Cardiovascular: Positive for palpitations. Negative for chest pain and leg swelling.  Gastrointestinal: Negative.   Musculoskeletal: Negative.   Neurological: Positive for dizziness and light-headedness. Negative for headaches.  Psychiatric/Behavioral: The patient is nervous/anxious.     Allergies  Review of patient's allergies  indicates no known allergies.  Home Medications   Prior to Admission medications   Medication Sig Start Date End Date Taking? Authorizing Provider  benzonatate (TESSALON) 100 MG capsule Take 1 capsule (100 mg total) by mouth every 8 (eight) hours. 10/11/15   Dorie Rank, MD  Diphenhydramine-PE-APAP (THERAFLU WARMING RELIEF FLU) 12.5-5-325 MG/15ML LIQD Take 15 mLs by mouth 2 (two) times daily.    Historical Provider, MD  propranolol (INDERAL) 10 MG tablet Take 1/2 to 1 tablet 45 minutes before driving. No more than 1 tablet every 8 hours. 12/13/15   Janne Napoleon, NP   Meds Ordered and Administered this Visit  Medications - No data to display  BP 122/74 mmHg  Pulse 74  Temp(Src) 98.6 F (37 C) (Oral)  Resp 12  SpO2 100%  LMP 12/10/2015 (Exact Date) No data found.   Physical Exam  Constitutional: She is oriented to person, place, and time. She appears well-developed and well-nourished. No distress.  Eyes: EOM are normal.  Neck: Normal range of motion. Neck supple.  Cardiovascular: Normal rate.   Pulmonary/Chest: Effort normal. No respiratory distress.  Musculoskeletal: She exhibits no edema.  Neurological: She is alert and oriented to person, place, and time. She exhibits normal muscle tone.  Skin: Skin is warm and dry.  Psychiatric: She has a normal mood and affect. Her speech is normal and behavior is normal. Judgment and thought content normal. Thought content is not delusional. Cognition and memory are normal. She expresses no homicidal and no  suicidal ideation. She expresses no suicidal plans and no homicidal plans.  Nursing note and vitals reviewed.   ED Course  Procedures (including critical care time)  Labs Review Labs Reviewed - No data to display  Imaging Review No results found.   Visual Acuity Review  Right Eye Distance:   Left Eye Distance:   Bilateral Distance:    Right Eye Near:   Left Eye Near:    Bilateral Near:         MDM   1. Panic attacks    2. Situational anxiety    Meds ordered this encounter  Medications  . propranolol (INDERAL) 10 MG tablet    Sig: Take 1/2 to 1 tablet 45 minutes before driving. No more than 1 tablet every 8 hours.    Dispense:  12 tablet    Refill:  0    Order Specific Question:  Supervising Provider    Answer:  Melony Overly [id number]   Referred to [facility]. Need to obtain a PCP ASAP.     Janne Napoleon, NP 12/13/15 [phone]

## 2015-12-13 NOTE — Discharge Instructions (Signed)
Panic Attacks Panic attacks are sudden, short-livedsurges of severe anxiety, fear, or discomfort. They may occur for no reason when you are relaxed, when you are anxious, or when you are sleeping. Panic attacks may occur for a number of reasons:   Healthy people occasionally have panic attacks in extreme, life-threatening situations, such as war or natural disasters. Normal anxiety is a protective mechanism of the body that helps [place] react to danger (fight or flight response).  Panic attacks are often seen with anxiety disorders, such as panic disorder, social anxiety disorder, generalized anxiety disorder, and phobias. Anxiety disorders cause excessive or uncontrollable anxiety. They may interfere with your relationships or other life activities.  Panic attacks are sometimes seen with other mental illnesses, such as depression and posttraumatic stress disorder.  Certain medical conditions, prescription medicines, and drugs of abuse can cause panic attacks. SYMPTOMS  Panic attacks start suddenly, peak within 20 minutes, and are accompanied by four or more of the following symptoms:  Pounding heart or fast heart rate (palpitations).  Sweating.  Trembling or shaking.  Shortness of breath or feeling smothered.  Feeling choked.  Chest pain or discomfort.  Nausea or strange feeling in your stomach.  Dizziness, light-headedness, or feeling like you will faint.  Chills or hot flushes.  Numbness or tingling in your lips or hands and feet.  Feeling that things are not real or feeling that you are not yourself.  Fear of losing control or going crazy.  Fear of dying. Some of these symptoms can mimic serious medical conditions. For example, you may think you are having a heart attack. Although panic attacks can be very scary, they are not life threatening. DIAGNOSIS  Panic attacks are diagnosed through an assessment by your health care provider. Your health care provider will ask  questions about your symptoms, such as where and when they occurred. Your health care provider will also ask about your medical history and use of alcohol and drugs, including prescription medicines. Your health care provider may order blood tests or other studies to rule out a serious medical condition. Your health care provider may refer you to a mental health professional for further evaluation. TREATMENT   Most healthy people who have one or two panic attacks in an extreme, life-threatening situation will not require treatment.  The treatment for panic attacks associated with anxiety disorders or other mental illness typically involves counseling with a mental health professional, medicine, or a combination of both. Your health care provider will help determine what treatment is best for you.  Panic attacks due to physical illness usually go away with treatment of the illness. If prescription medicine is causing panic attacks, talk with your health care provider about stopping the medicine, decreasing the dose, or substituting another medicine.  Panic attacks due to alcohol or drug abuse go away with abstinence. Some adults need professional help in order to stop drinking or using drugs. HOME CARE INSTRUCTIONS   Take all medicines as directed by your health care provider.   Schedule and attend follow-up visits as directed by your health care provider. It is important to keep all your appointments. SEEK MEDICAL CARE IF:  You are not able to take your medicines as prescribed.  Your symptoms do not improve or get worse. SEEK IMMEDIATE MEDICAL CARE IF:   You experience panic attack symptoms that are different than your usual symptoms.  You have serious thoughts about hurting yourself or others.  You are taking medicine for panic attacks and  have a serious side effect. MAKE SURE YOU:  Understand these instructions.  Will watch your condition.  Will get help right away if you are not  doing well or get worse.   This information is not intended to replace advice given to you by your health care provider. Make sure you discuss any questions you have with your health care provider.   Document Released: 08/05/2005 Document Revised: 08/10/2013 Document Reviewed: 03/19/2013 Elsevier Interactive Patient Education 2016 [place] Anxiety Disorder Social anxiety disorder, previously called social phobia, is a mental disorder. People with social anxiety disorder frequently feel nervous, afraid, or embarrassed when around other people in social situations. They constantly worry that other people are judging or criticizing them for how they look, what they say, or how they act. They may worry that other people might reject them because of their appearance or behavior. Social anxiety disorder is more than just occasional shyness or self-consciousness. It can cause severe emotional distress. It can interfere with daily life activities. Social anxiety disorder also may lead to excessive alcohol or drug use and even suicide.  Social anxiety disorder is actually one of the most common mental disorders. It can develop at any time but usually starts in the teenage years. Women are more commonly affected than men. Social anxiety disorder is also more common in people who have family members with anxiety disorders. It also is more common in people who have physical deformities or conditions with characteristics that are obvious to others, such as stuttered speech or movement abnormalities (Parkinson disease).  SYMPTOMS  In addition to feeling anxious or fearful in social situations, people with social anxiety disorder frequently have physical symptoms. Examples include:  Red face (blushing).  Racing heart.  Sweating.  Shaky hands or voice.  Confusion.  Light-headedness.  Upset stomach and diarrhea. DIAGNOSIS  Social anxiety disorder is diagnosed through an assessment by your  health care provider. Your health care provider will ask you questions about your mood, thoughts, and reactions in social situations. Your health care provider may ask you about your medical history and use of alcohol or drugs, including prescription medicines. Certain medical conditions and the use of certain substances, including caffeine, can cause symptoms similar to social anxiety disorder. Your health care provider may refer you to a mental health specialist for further evaluation or treatment. The criteria for diagnosis of social anxiety disorder are:  Marked fear or anxiety in one or more social situations in which you may be closely watched or studied by others. Examples of such situations include:  Interacting socially (having a conversation with others, going to a party, or meeting strangers).  Being observed (eating or drinking in public or being called on in class).  Performing in front of others (giving a speech).  The social situations of concern almost always cause fear or anxiety, not just occasionally.  People with social anxiety disorder fear that they will be viewed negatively in a way that will be embarrassing, will lead to rejection, or will offend others. This fear is out of proportion to the actual threat posed by the social situation.  Often the triggering social situations are avoided, or they are endured with intense fear or anxiety. The fear, anxiety, or avoidance is persistent and lasts for 6 months or longer.  The anxiety causes difficulty functioning in at least some parts of your daily life. TREATMENT  Several types of treatment are available for social anxiety disorder. These treatments are often used  in combination and include:   Talk therapy. Group talk therapy allows you to see that you are not alone with these problems. Individual talk therapy helps you address your specific anxiety issues with a caring professional. The most effective forms of talk therapy  for social anxiety disorder are cognitive-behavioral therapy and exposure therapy. Cognitive-behavioral therapy helps you to identify and change negative thoughts and beliefs that are at the root of the disorder. Exposure therapy allows you to gradually face the situations that you fear most.  Relaxation and coping techniques. These include deep breathing, self-talk, meditation, visual imagery, and yoga. Relaxation techniques help to keep you calm in social situations.  Social Company secretary.Social skills can be learned on your own or with the help of a talk therapist. They can help you feel more confident and comfortable in social situations.  Medicine. For anxiety limited to performance situations (performance anxiety), medicine called beta blockers can help by reducing or preventing the physical symptoms of social anxiety disorder. For more persistent and generalized social anxiety, antidepressant medicine may be prescribed to help control symptoms. In severe cases of social anxiety disorder, strong antianxiety medicine, called benzodiazepines, may be prescribed on a limited basis and for a short time.   This information is not intended to replace advice given to you by your health care provider. Make sure you discuss any questions you have with your health care provider.   Document Released: 07/04/2005 Document Revised: 08/26/2014 Document Reviewed: 11/03/2012 Elsevier Interactive Patient Education [employer].

## 2015-12-18 ENCOUNTER — Emergency Department (HOSPITAL_COMMUNITY)
Admission: EM | Admit: 2015-12-18 | Discharge: 2015-12-18 | Disposition: A | Discharge status: Home / Self Care | Payer: Medicaid Other | Attending: Emergency Medicine | Admitting: Emergency Medicine

## 2015-12-18 ENCOUNTER — Encounter (HOSPITAL_COMMUNITY): Payer: Self-pay | Admitting: Adult Health

## 2015-12-18 ENCOUNTER — Emergency Department (HOSPITAL_COMMUNITY): Payer: Medicaid Other

## 2015-12-18 DIAGNOSIS — R0602 Shortness of breath: Secondary | ICD-10-CM | POA: Insufficient documentation

## 2015-12-18 DIAGNOSIS — R42 Dizziness and giddiness: Secondary | ICD-10-CM | POA: Insufficient documentation

## 2015-12-18 NOTE — ED Notes (Signed)
Presents with SOB began last night and has worsened, denies nausea, endorses slight dizziness last night. "I feel like my breathing is not normal. I feel like i have to breath harder than I used to" breath sounds clear., denies chest pain. Denies pain with inspiration. VSS. Respirations unlabored.

## 2015-12-18 NOTE — ED Notes (Signed)
Pt at desk stating she feels better and is leaving, will follow up with PCP, encouraged to stay, test results reviewed.

## 2016-01-28 ENCOUNTER — Emergency Department: Payer: Medicaid HMO

## 2016-01-28 ENCOUNTER — Emergency Department
Admission: EM | Admit: 2016-01-28 | Discharge: 2016-01-28 | Disposition: A | Discharge status: Home / Self Care | Payer: No Typology Code available for payment source | Attending: Emergency Medicine | Admitting: Emergency Medicine

## 2016-01-28 DIAGNOSIS — R002 Palpitations: Secondary | ICD-10-CM | POA: Insufficient documentation

## 2016-01-28 LAB — CBC AND DIFFERENTIAL
Basophils Absolute Automated: 0.03 10*3/uL (ref 0.00–0.20)
Basophils Automated: 0.7 %
Eosinophils Absolute Automated: 0.17 10*3/uL (ref 0.00–0.70)
Eosinophils Automated: 3.9 %
Hematocrit: 36.6 % — ABNORMAL LOW (ref 37.0–47.0)
Hgb: 12.2 g/dL (ref 12.0–16.0)
Lymphocytes Absolute Automated: 1.91 10*3/uL (ref 0.50–4.40)
Lymphocytes Automated: 43.4 %
MCH: 27.7 pg — ABNORMAL LOW (ref 28.0–32.0)
MCHC: 33.3 g/dL (ref 32.0–36.0)
MCV: 83.2 fL (ref 80.0–100.0)
MPV: 11 fL (ref 9.4–12.3)
Monocytes Absolute Automated: 0.54 10*3/uL (ref 0.00–1.20)
Monocytes: 12.3 %
Neutrophils Absolute: 1.75 10*3/uL — ABNORMAL LOW (ref 1.80–8.10)
Neutrophils: 39.7 %
Platelets: 237 10*3/uL (ref 140–400)
RBC: 4.4 10*6/uL (ref 4.20–5.40)
RDW: 13 % (ref 12–15)
WBC: 4.4 10*3/uL (ref 3.50–10.80)

## 2016-01-28 LAB — COMPREHENSIVE METABOLIC PANEL
ALT: 9 U/L (ref 0–55)
AST (SGOT): 17 U/L (ref 5–34)
Albumin/Globulin Ratio: 1.4 (ref 0.9–2.2)
Albumin: 4.2 g/dL (ref 3.5–5.0)
Alkaline Phosphatase: 50 U/L (ref 37–106)
Anion Gap: 13 (ref 5.0–15.0)
BUN: 9 mg/dL (ref 7.0–19.0)
Bilirubin, Total: 1.1 mg/dL (ref 0.2–1.2)
CO2: 20 mEq/L — ABNORMAL LOW (ref 22–29)
Calcium: 9.7 mg/dL (ref 8.5–10.5)
Chloride: 108 mEq/L (ref 100–111)
Creatinine: 0.7 mg/dL (ref 0.6–1.0)
Globulin: 3.1 g/dL (ref 2.0–3.6)
Glucose: 109 mg/dL — ABNORMAL HIGH (ref 70–100)
Potassium: 3 mEq/L — ABNORMAL LOW (ref 3.5–5.1)
Protein, Total: 7.3 g/dL (ref 6.0–8.3)
Sodium: 141 mEq/L (ref 136–145)

## 2016-01-28 LAB — TROPONIN I: Troponin I: <0.01 ng/mL (ref 0.00–0.09)

## 2016-01-28 LAB — GFR: EGFR: >60

## 2016-01-28 LAB — URINE HCG QUALITATIVE: Urine HCG Qualitative: NEGATIVE

## 2016-01-28 MED ORDER — LORAZEPAM 2 MG/ML IJ SOLN
0.5000 mg | Freq: Once | INTRAMUSCULAR | Status: AC
Start: 2016-01-28 — End: 2016-01-28
  Administered 2016-01-28: 0.5 mg via INTRAVENOUS
  Filled 2016-01-28: qty 1

## 2016-01-28 MED ORDER — SODIUM CHLORIDE 0.9 % IV BOLUS
1000.0000 mL | Freq: Once | INTRAVENOUS | Status: AC
Start: 2016-01-28 — End: 2016-01-28
  Administered 2016-01-28: 1000 mL via INTRAVENOUS

## 2016-01-28 MED ORDER — POTASSIUM CHLORIDE 20 MEQ PO PACK
20.0000 meq | PACK | Freq: Once | ORAL | Status: AC
Start: 2016-01-28 — End: 2016-01-28
  Administered 2016-01-28: 20 meq via ORAL
  Filled 2016-01-28: qty 1

## 2016-01-28 NOTE — ED Notes (Signed)
Pt d/c to home. Stable, ambulates with steady gait. All belongings with pt.   D/C with husband  Advised pt not to drive due to side effects of medication administered in ER.

## 2016-01-28 NOTE — ED Provider Notes (Signed)
EMERGENCY DEPARTMENT HISTORY AND PHYSICAL EXAM     Physician/Midlevel provider first contact with patient: 01/28/16 0030         Date: 01/28/2016  Patient Name: Debra Norman    History of Presenting Illness     Chief Complaint   Patient presents with   . Palpitations   . Shortness of Breath       History Provided By: Patient and Patient's Husband    Chief Complaint: Shortness of Breath  Onset: 30 minutes ago   Timing: Intermittent  Location: Cardiovascular  Severity: Moderate  Exacerbating factors: None  Alleviating factors: None  Associated Symptoms: Palpitations, hand and face tingling, anxiety  Pertinent Negatives: No history of PE or DVT. No leg pain or swelling. No CP, cough, hemoptysis, or lightheadedness. No recent travel, surgery, or exogenous estrogen. No history of blood clotting disorder. No smoking or illicit drugs. No family history of sudden death in young people or premature heart disease.       Additional History: Debra Norman is a 35 y.o. female presenting to the ED with shortness of breath since 30 minutes ago with associated palpitations and tingling to the hands and face. Pt states that she was awoken by the shortness of breath. She reports that she has been eating and drinking okay. Pt's husband notes that pt had "panic attacks" over the past month where she had similar symptoms. He believes the pt has anxiety from him being away from home and multiple children being sick with cold symptoms among other stressors. Pt denies SI or HI.    PCP: Pcp, Noneorunknown, MD    No current facility-administered medications for this encounter.     No current outpatient prescriptions on file.       Past History     Past Medical History:  History reviewed. No pertinent past medical history.    Past Surgical History:  History reviewed. No pertinent past surgical history.    Family History:  No family history on file.    Social History:  Social History   Substance Use Topics   . Smoking status: Never Smoker    .  Smokeless tobacco: None   . Alcohol Use: No       Allergies:  No Known Allergies    Review of Systems     Review of Systems   Constitutional: Negative for fever and fatigue.   HENT: Negative for rhinorrhea and sore throat.    Eyes: Negative for discharge, redness and visual disturbance.   Respiratory: Positive for shortness of breath. Negative for cough.    Cardiovascular: Positive for palpitations. Negative for chest pain and leg swelling.   Gastrointestinal: Negative for nausea and abdominal pain.   Endocrine: Negative for polyuria.   Genitourinary: Negative for dysuria, urgency, frequency and flank pain.   Musculoskeletal: Negative for back pain, neck pain and neck stiffness.   Skin: Negative for rash.   Allergic/Immunologic: Negative for immunocompromised state.   Neurological: Negative for light-headedness and headaches.   Hematological: Does not bruise/bleed easily.   Psychiatric/Behavioral: Negative for suicidal ideas.       Physical Exam   BP 107/73 mmHg  Pulse 72  Temp(Src) 98.4 F (36.9 C) (Oral)  Resp 19  Ht 5\' 4"  (1.626 m)  Wt 61.689 kg  BMI 23.33 kg/m2  SpO2 99%  LMP 01/02/2016  Breastfeeding? Unknown    Constitutional: Vital signs reviewed. Well appearing. No distress.  Head: Normocephalic, atraumatic  Eyes: Conjunctiva and sclera are normal.  No  injection or discharge.  Ears, Nose, Throat:  Normal external examination of the nose and ears.  Mucous membranes moist.  Neck: Normal range of motion. Supple, no meningeal signs. Trachea midline. No JVD  Respiratory/Chest: Clear to auscultation. No respiratory distress.   Cardiovascular: Regular rate and rhythm. No murmurs.  Abdomen:  Bowel sounds intact. No rebound or guarding. Soft.  Non-tender.  Back: No cva tenderness to percussion.  Upper Extremity:  No edema. No cyanosis. Bilateral radial pulses intact and equal.   Lower Extremity:  No edema. No cyanosis. Bilateral calves symmetrical and non-tender. Bilateral femoral, DP, PT pulses intact and  equal.  Skin: Warm and dry. No rash.  Neuro: A&Ox3. CNII -XII intact to testing. Strength 5/5 and symmetrical in the bilateral upper and lower extremities. Sensation to sharp touch intact and equal in the bilateral upper and lower extremities. Coordination intact to finger to nose testing. Normal gait.   Psychiatric: Anxious affect.  Normal insight.          Diagnostic Study Results     Labs -     Results     Procedure Component Value Units Date/Time    Beta HCG, Drema [place], Urine [[id number]] Collected:  01/28/16 0110    Specimen Information:  Urine Updated:  01/28/16 0124     Urine HCG Qualitative Negative     Troponin I [[id number]] Collected:  01/28/16 0054    Specimen Information:  Blood Updated:  01/28/16 0124     Troponin I <0.01 ng/mL     GFR [[id number]] Collected:  01/28/16 0054     EGFR >60.0 Updated:  01/28/16 0123    Comprehensive Metabolic Panel (CMP) [[id number]]  (Abnormal) Collected:  01/28/16 0054    Specimen Information:  Blood Updated:  01/28/16 0123     Glucose 109 (H) mg/dL      BUN 9.0 mg/dL      Creatinine 0.7 mg/dL      Sodium 301 mEq/L      Potassium 3.0 (L) mEq/L      Chloride 108 mEq/L      CO2 20 (L) mEq/L      Calcium 9.7 mg/dL      Protein, Total 7.3 g/dL      Albumin 4.2 g/dL      AST (SGOT) 17 U/L      ALT 9 U/L      Alkaline Phosphatase 50 U/L      Bilirubin, Total 1.1 mg/dL      Globulin 3.1 g/dL      Albumin/Globulin Ratio 1.4      Anion Gap 13.0     CBC and differential [[id number]]  (Abnormal) Collected:  01/28/16 0054    Specimen Information:  Blood from Blood Updated:  01/28/16 0117     WBC 4.40 x10 3/uL      Hgb 12.2 g/dL      Hematocrit 57.3 (L) %      Platelets 237 x10 3/uL      RBC 4.40 x10 6/uL      MCV 83.2 fL      MCH 27.7 (L) pg      MCHC 33.3 g/dL      RDW 13 %      MPV 11.0 fL      Neutrophils 39.7 %      Lymphocytes Automated 43.4 %      Monocytes 12.3 %      Eosinophils Automated 3.9 %      Basophils Automated 0.7 %  Neutrophils Absolute 1.75 (L) x10 3/uL      Abs Lymph  Automated 1.91 x10 3/uL      Abs Mono Automated 0.54 x10 3/uL      Abs Eos Automated 0.17 x10 3/uL      Absolute Baso Automated 0.03 x10 3/uL           Radiologic Studies -   Radiology Results (24 Hour)     ** No results found for the last 24 hours. **      .    Medical Decision Making   I am the first provider for this patient.    I reviewed the vital signs, available nursing notes, past medical history, past surgical history, family history and social history.    Vital Signs-Reviewed the patient's vital signs.     No data found.      Pulse Oximetry Analysis - Normal 100% on RA    Cardiac Monitor:  Rate: 82  Rhythm:  Normal Sinus Rhythm     EKG:  Interpreted by the EP.   Time Interpreted: 0033   Rate: 86   Rhythm: Normal Sinus Rhythm    Interpretation: No ST elevation. There is ST depression in V3 through V5.    Comparison: No prior study available for comparison     EKG:  Interpreted by the EP.   Time Interpreted: 0209   Rate: 70   Rhythm: Normal Sinus Rhythm    Interpretation: No ST elevation.    Comparison: Compared to EKG done earlier today, ST depression in V3 through V5 is resolved.    Old Medical Records: Nursing notes.   Old records.    ED Course:     12:36 AM - Pt and husband are agreeable with ED plan.    1:20 AM - Pt resting comfortably. Feels much better after ativan. Appears calm     2:02 AM - Pt is feeling much better, resting calmly. Asymptomatic. Reviewed all results. Pt agrees to follow up with primary care, cardiology, and mental health. Reviewed red flags for return to ED and pt and husband voiced understanding.      Provider Notes: Pt presenting with SOB, tingling in extremities, and anxiety prior to arrival. She has had multiple similar episodes over the past month and appeared anxious in the ED. Non-specific ST abnormality on EKG. Symptoms resolved after ativan. No abnormal heart rhythm during ED observation. Wells zero, PERC zero, doubt PE. Low clinical suspicion for cardiac etiology of  symptoms given response to ativan. No JVD or muffled heart sounds to suggest tamponade. Equal breath sounds throughout lung fields, doubt PTX. No infectious complaints or SIRS criteria to suggest pneumonia. Will f/u closely as above.       Diagnosis     Clinical Impression:   1. Palpitations        Treatment Plan:   ED Disposition     Discharge Laurell Fugitt discharge to home/self care.    Condition at disposition: Stable              _______________________________      Attestations: This note is prepared by Annia Belt, acting as scribe for Lynnea Ferrier, MD.    Lynnea Ferrier, MD - The scribe's documentation has been prepared under my direction and personally reviewed by me in its entirety.  I confirm that the note above accurately reflects all work, treatment, procedures, and medical decision making performed by me.    _______________________________      Lynnea Ferrier  S, MD  01/29/16 1659

## 2016-01-28 NOTE — Discharge Instructions (Signed)
Palpitations     You have been diagnosed with "palpitations."     Palpitations are beats in the chest that feel funny or strange. They are often caused by extra heartbeats that come earlier than normal. These are either "premature atrial contractions" or "premature ventricular contractions," depending on where in the heart they happen. Palpitations often go away on their own and often cause no serious problems. They are sometimes related to stress or lack of sleep. They can also be related too much caffeine. These symptoms can be caused by many over-the-counter (no prescription needed) cold medicines, diet pills and "natural" vitamin supplements with stimulants, often ephedrine (Ephedrine is also known by its traditional Chinese name, Ma huang).     Palpitations feel different for different people. Some patients describe a feeling of "butterflies" in the chest. Others say it feels like the heart is "flipping over" in the chest. Palpitations may happen often but should last only a second or two each time. They should not cause any chest pain, lightheadedness, dizziness or fainting.     There is no specific treatment for palpitations. However, you should avoid all caffeine and cold medications. Also avoid all natural stimulants and chocolate.     Follow up with your primary doctor in the next week to make sure your symptoms are getting better. In some cases, a Holter monitor may be ordered. A Holter monitor is a portable heart monitor. It records your heart's electrical rhythm. You will need to see your regular doctor to get the results from the test.     YOU SHOULD SEEK MEDICAL ATTENTION IMMEDIATELY, EITHER HERE OR AT THE NEAREST EMERGENCY DEPARTMENT, IF ANY OF THE FOLLOWING OCCURS:  · Lightheadedness or the feeling you might faint.  · An unusually fast or slow heart rate.  · Chest pain or shortness of breath.  · Palpitations increase when you exercise.  · Any other worsening symptoms or concerns.

## 2016-01-28 NOTE — ED Notes (Signed)
Pt states she was awakened by sob 30 min pta, also c/o palpitations and tingling to hands and face en route to ED.  Pt reports hx of anxiety attack.  Pt appears unlabored, skin is warm and dry.

## 2016-01-29 LAB — ECG 12-LEAD
Atrial Rate: 70 {beats}/min
Atrial Rate: 86 {beats}/min
P Axis: 63 degrees
P Axis: 76 degrees
P-R Interval: 144 ms
P-R Interval: 154 ms
Q-T Interval: 380 ms
Q-T Interval: 414 ms
QRS Duration: 86 ms
QRS Duration: 90 ms
QTC Calculation (Bezet): 447 ms
QTC Calculation (Bezet): 454 ms
R Axis: 80 degrees
R Axis: 90 degrees
T Axis: -2 degrees
T Axis: 42 degrees
Ventricular Rate: 70 {beats}/min
Ventricular Rate: 86 {beats}/min

## 2016-02-14 ENCOUNTER — Emergency Department: Payer: Self-pay

## 2016-02-14 ENCOUNTER — Emergency Department
Admission: EM | Admit: 2016-02-14 | Discharge: 2016-02-14 | Disposition: A | Discharge status: Home / Self Care | Payer: Self-pay | Attending: Emergency Medicine | Admitting: Emergency Medicine

## 2016-02-14 DIAGNOSIS — X58XXXA Exposure to other specified factors, initial encounter: Secondary | ICD-10-CM | POA: Insufficient documentation

## 2016-02-14 DIAGNOSIS — S63501A Unspecified sprain of right wrist, initial encounter: Secondary | ICD-10-CM | POA: Insufficient documentation

## 2016-02-14 NOTE — ED Notes (Signed)
Pt arrived ambulatory with c/o right wrist pain.  Onset one week ago.  Pt denies any injury.  Pt is consistently rubbing the wrist during triage.  Slightly pink in color.  Pt stated my left wrist is just beginning to hurt.  Husband stated heavy lifting of kids and car seat.  Pt stated I also want my toe checked for nerve damage.  Pt denies any injury to the right foot 2nd toe.

## 2016-02-14 NOTE — Discharge Instructions (Signed)
Overuse Syndrome    You have been diagnosed with overuse syndrome.    Overuse syndrome is when you have tenderness in a part of your body that you use repetitively. This usually happens from doing the same task over and over again. Other names for overuse syndrome are 'cumulative trauma disorder' or 'repetitive strain disorder'. Overuse syndrome mostly affects joints and can involve the muscle, tendon, bone or bursa of the joint. With repeated use or overstretching, these parts of your body become painful or even injured. In overuse syndrome, the part of the joint that is affected does not heal properly. People at risk for overuse syndrome include athletes and workers who do repetitive tasks like typing.    Examples of overuse syndromes that you may have heard of are:   Carpal tunnel syndrome.   Tennis elbow.   Tendonitis.   Golfer's elbow.   Shin splints.    Overuse syndrome is often diagnosed by history and physical exam. Sometimes an ultrasound or an MRI (magnetic resonance imaging) is done to confirm the diagnosis. The treatment of overuse syndrome usually is rest, ice or heat to the affected joint, and stretching. It is important to work with a physical therapist. This is so you can strengthen the joint and slowly return to normal activity.    You might need another exam or more tests to find out why you have overuse syndrome. At this time, it doesn't seem that your symptoms are caused by anything dangerous. You do not need to stay in the hospital.    Some things you can try at home to improve symptoms are:   Stretch before exercising.   Avoid repetitive movements.   Get plenty of rest.   Put ice or heat on the affected area.    If you exercise often have a trainer help you work on technique (how you exercise).   You may take NSAID pain medications like ibuprofen (Advil) or naproxen (Naprosyn).    Though we don't believe your condition is dangerous right now, it is important to be careful.  Sometimes a problem that seems mild can become serious later. This is why it is very important that you return here or go to the nearest Emergency Department if you are not improving or your symptoms are getting worse.    YOU SHOULD SEEK MEDICAL ATTENTION IMMEDIATELY, EITHER HERE OR AT THE NEAREST EMERGENCY DEPARTMENT, IF ANY OF THE FOLLOWING OCCUR:     You get a fever (temperature higher than 100.4F or 38C) or chills.   Your pain does not go away or gets worse.   Your joint becomes red, hot or swollen.   You can't move the joint.    If you can't follow up with your doctor, or if at any time you feel you need to be rechecked or seen again, come back here or go to the nearest emergency department.                 R.I.C.E.    Some things you can do to help your injury are: Resting, Icing, Compressing and Elevating the injured area. Remember this as "RICE."   REST: Limit the use of the injured body part.   ICE: By applying ice to the affected area, swelling and pain can be reduced. Place some ice cubes in a re-sealable (Ziploc) bag and add some water. Put a thin washcloth between the bag and the skin. Apply the ice bag to the area for at least 20   minutes. Do this at least 4 times per day. It is okay to do this more often than directed. You can also do it for longer than directed. NEVER APPLY ICE DIRECTLY TO THE SKIN.   COMPRESS: Compression means to apply pressure around the injured area such as with a splint, cast or an ACE bandage. Compression decreases swelling and improves comfort. Compression should be tight enough to relieve swelling but not so tight as to decrease circulation. Increasing pain, numbness, tingling, or change in skin color, are all signs of decreased circulation.   ELEVATE: Elevate the injured part. For example, elevate your foot by placing it on a chair while sitting, or propping it up on pillows when lying down.

## 2016-02-14 NOTE — ED Provider Notes (Signed)
EMERGENCY DEPARTMENT HISTORY AND PHYSICAL EXAM     Physician/Midlevel provider first contact with patient: 02/14/16 2037         Date: 02/14/2016  Patient Name: Debra Norman    History of Presenting Illness     Chief Complaint   Patient presents with   . Wrist Pain       History Provided By: pt and her husband    Chief Complaint: wrist pain   Onset: for the past wk  Location: bilateral (R>L)  Exacerbating factors: movement  Alleviating factors: none  Associated Symptoms: numbness  Pertinent Negatives: denies injury or trauma    Additional History: Debra Norman is a 35 y.o. female c/o bilateral wrist pain (R>L) for the past wk. Pain is worst to the lateral aspect of the R wrist, especially with movement. 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 She is RHD. NVI distally. Also concerned about possible low iron levels and intermittent R second toe numbness. NKDA.    PCP: Pcp, Noneorunknown, MD  SPECIALISTS:    No current facility-administered medications for this encounter.     No current outpatient prescriptions on file.       Past History     Past Medical History:  History reviewed. No pertinent past medical history.    Past Surgical History:  History reviewed. No pertinent past surgical history.    Family History:  History reviewed. No pertinent family history.    Social History:  Social History   Substance Use Topics   . Smoking status: Never Smoker    . Smokeless tobacco: None   . Alcohol Use: No       Allergies:  No Known Allergies    Review of Systems     Review of Systems   Constitutional: Negative for fever and fatigue.   HENT: Negative for rhinorrhea and sore throat.    Eyes: Negative for discharge, redness and visual disturbance.   Respiratory: Negative for cough and shortness of breath.    Cardiovascular: Negative for chest pain and leg swelling.   Gastrointestinal: Negative for nausea and  abdominal pain.   Endocrine: Negative for polyuria.   Genitourinary: Negative for dysuria, urgency, frequency and flank pain.   Musculoskeletal: Positive for arthralgias. Negative for back pain, neck pain and neck stiffness.   Skin: Negative for rash.   Allergic/Immunologic: Negative for immunocompromised state.   Neurological: Positive for numbness. Negative for light-headedness and headaches.   Hematological: Does not bruise/bleed easily.   Psychiatric/Behavioral: Negative for suicidal ideas.          Physical Exam   BP 111/58 mmHg  Pulse 74  Temp(Src) 98.4 F (36.9 C) (Oral)  Resp 16  Ht 5\' 4"  (1.626 m)  Wt 61.689 kg  BMI 23.33 kg/m2  SpO2 98%  LMP 12/29/2015  Breastfeeding? No    Physical Exam   Constitutional: She is oriented to person, place, and time and well-developed, well-nourished, and in no distress.   HENT:   Head: Normocephalic and atraumatic.   Nose: Nose normal.   Mouth/Throat: Oropharynx is clear and moist.   Eyes: Conjunctivae are normal. Right eye exhibits no discharge. Left eye exhibits no discharge.   Neck: Normal range of motion. Neck supple.   Cardiovascular: Normal rate, regular rhythm, normal heart sounds and intact distal pulses.  Exam reveals no gallop and no friction rub.  No murmur heard.  Pulmonary/Chest: Effort normal and breath sounds normal. No respiratory distress. She has no wheezes. She has no rales. She exhibits no tenderness.   Abdominal: Soft. She exhibits no distension and no mass. There is no tenderness. There is no rebound and no guarding.   Musculoskeletal: Normal range of motion. She exhibits no edema or tenderness.   Lymphadenopathy:     She has no cervical adenopathy.   Neurological: She is alert and oriented to person, place, and time. She has normal sensation and normal strength. GCS score is 15.   Skin: Skin is warm and dry. No rash noted. She is not diaphoretic. No erythema.   Psychiatric: Memory and affect normal.   Nursing note and vitals  reviewed.        Diagnostic Study Results     Labs -     Results     ** No results found for the last 24 hours. **          Radiologic Studies -   Radiology Results (24 Hour)     Procedure Component Value Units Date/Time    Wrist Right PA Lateral and Oblique [[id number]] Collected:  02/14/16 2113    Order Status:  Completed Updated:  02/14/16 2117    Narrative:      RIGHT WRIST: 4  views     CLINICAL STATEMENT: right wrist pain; denies direct trauma    COMPARISON: No prior studies are available for comparison    FINDINGS: There is no fracture or dislocation. The soft tissues are  unremarkable.      Impression:        No fracture.    Fonnie Mu, MD   02/14/2016 9:13 PM        .    Medical Decision Making   I am the first provider for this patient.    I reviewed the vital signs, available nursing notes, past medical history, past surgical history, family history and social history.    Vital Signs-Reviewed the patient's vital signs.     Patient Vitals for the past 12 hrs:   BP Temp Pulse Resp   02/14/16 2031 111/58 mmHg 98.4 F (36.9 C) 74 16       Pulse Oximetry Analysis - Normal 98% on RA    Clinical Decision Support:     Old Medical Records: Nursing notes.     ED Course:     9:20 PM - Discussed diagnosis, treatment, results and return precautions with pt. Gave referrals to primary care. Gave wrist splint. All questions answered at this time. Pt is stable and ready for discharge.    Provider Notes:   35yo female with wrist pain this week. I suspect overuse injury. Imaging is reassuring. Velcro splint applied. NSAIDs at home. [place] home with family. Orthopedics referral PRN. PCP follow up directed. Return precautions given.      Diagnosis     Clinical Impression:   1. Right wrist sprain, initial encounter        Treatment Plan:   ED Disposition     Discharge [name] discharge to home/self care.    Condition at disposition: Stable              _______________________________      Attestations: This note is  prepared by Sherene Sires, acting as scribe for Rachel Bo, MD.    Rachel Bo, MD - The scribe's documentation has been prepared under my direction and personally reviewed by me  in its entirety.  I confirm that the note above accurately reflects all work, treatment, procedures, and medical decision making performed by me.    _______________________________      Louis Matte, MD  02/15/16 [phone]

## 2016-02-24 ENCOUNTER — Emergency Department: Payer: No Typology Code available for payment source

## 2016-02-24 ENCOUNTER — Emergency Department
Admission: EM | Admit: 2016-02-24 | Discharge: 2016-02-24 | Disposition: A | Discharge status: Home / Self Care | Payer: No Typology Code available for payment source | Attending: Emergency Medicine | Admitting: Emergency Medicine

## 2016-02-24 DIAGNOSIS — R5383 Other fatigue: Secondary | ICD-10-CM | POA: Insufficient documentation

## 2016-02-24 DIAGNOSIS — N390 Urinary tract infection, site not specified: Secondary | ICD-10-CM | POA: Insufficient documentation

## 2016-02-24 LAB — CBC AND DIFFERENTIAL
Basophils Absolute Automated: 0.02 10*3/uL (ref 0.00–0.20)
Basophils Automated: 0.4 %
Eosinophils Absolute Automated: 0.05 10*3/uL (ref 0.00–0.70)
Eosinophils Automated: 1.1 %
Hematocrit: 37.6 % (ref 37.0–47.0)
Hgb: 12.4 g/dL (ref 12.0–16.0)
Lymphocytes Absolute Automated: 1.08 10*3/uL (ref 0.50–4.40)
Lymphocytes Automated: 23.1 %
MCH: 27.6 pg — ABNORMAL LOW (ref 28.0–32.0)
MCHC: 33 g/dL (ref 32.0–36.0)
MCV: 83.6 fL (ref 80.0–100.0)
MPV: 10.9 fL (ref 9.4–12.3)
Monocytes Absolute Automated: 0.37 10*3/uL (ref 0.00–1.20)
Monocytes: 7.9 %
Neutrophils Absolute: 3.16 10*3/uL (ref 1.80–8.10)
Neutrophils: 67.5 %
Platelets: 257 10*3/uL (ref 140–400)
RBC: 4.5 10*6/uL (ref 4.20–5.40)
RDW: 14 % (ref 12–15)
WBC: 4.68 10*3/uL (ref 3.50–10.80)

## 2016-02-24 LAB — COMPREHENSIVE METABOLIC PANEL
ALT: 9 U/L (ref 0–55)
AST (SGOT): 14 U/L (ref 5–34)
Albumin/Globulin Ratio: 1.2 (ref 0.9–2.2)
Albumin: 4.2 g/dL (ref 3.5–5.0)
Alkaline Phosphatase: 49 U/L (ref 37–106)
Anion Gap: 12 (ref 5.0–15.0)
BUN: 9 mg/dL (ref 7.0–19.0)
Bilirubin, Total: 0.8 mg/dL (ref 0.2–1.2)
CO2: 20 mEq/L — ABNORMAL LOW (ref 22–29)
Calcium: 9.6 mg/dL (ref 8.5–10.5)
Chloride: 108 mEq/L (ref 100–111)
Creatinine: 0.7 mg/dL (ref 0.6–1.0)
Globulin: 3.5 g/dL (ref 2.0–3.6)
Glucose: 98 mg/dL (ref 70–100)
Potassium: 3.9 mEq/L (ref 3.5–5.1)
Protein, Total: 7.7 g/dL (ref 6.0–8.3)
Sodium: 140 mEq/L (ref 136–145)

## 2016-02-24 LAB — URINALYSIS, REFLEX TO MICROSCOPIC EXAM IF INDICATED
Bilirubin, UA: NEGATIVE
Blood, UA: NEGATIVE
Glucose, UA: NEGATIVE
Ketones UA: 25 — AB
Nitrite, UA: NEGATIVE
Protein, UR: 30 — AB
Specific Gravity UA: 1.005 (ref 1.001–1.035)
Urine pH: 7 (ref 5.0–8.0)
Urobilinogen, UA: NORMAL mg/dL

## 2016-02-24 LAB — GFR: EGFR: >60

## 2016-02-24 LAB — URINE HCG QUALITATIVE: Urine HCG Qualitative: NEGATIVE

## 2016-02-24 MED ORDER — NITROFURANTOIN MONOHYD MACRO 100 MG PO CAPS
100.0000 mg | ORAL_CAPSULE | Freq: Two times a day (BID) | ORAL | Status: AC
Start: 2016-02-24 — End: 2016-03-02

## 2016-02-24 MED ORDER — SODIUM CHLORIDE 0.9 % IV BOLUS
1000.0000 mL | Freq: Once | INTRAVENOUS | Status: AC
Start: 2016-02-24 — End: 2016-02-24
  Administered 2016-02-24: 1000 mL via INTRAVENOUS

## 2016-02-24 NOTE — Discharge Instructions (Signed)
You were seen in the [facility] Emergency Department by Dr. Michel Harrow.       For home care, please keep yourself hydrated.    Please make sure to return if you have fever, vomiting, chest pain, trouble breathing, if you are not getting better, or any other concerns.  We are always open and happy to help.    Please be aware that an emergency department diagnosis is usually preliminary, and that a definitive diagnosis cannot be made without the help of time or further testing.  Every disease has a progression and may take time before it is recognizable by a physician.  It is extremely important that you return to the Emergency Department or see your own doctor if you do not improve, or especially if your symptoms worsen or change.     In short, do not hesitate to return to the emergency department if you sense something is not right. We are never upset to see you again.    Take your discharge instructions to your primary care doctor and all other follow-up care so they have a record of what happened in your visit today. Emergency care is not a substitute for general medical care.    Urinary Tract Infection    You have been diagnosed with a lower urinary tract infection (UTI). This is also called cystitis.    Cystitis is an infection in your bladder. Your doctor diagnosed it by testing your urine. Cystitis usually causes burning with urination or frequent urination. It might make you feel like you have to urinate even when you don't.     Cystitis is usually treated with antibiotics and medicine to help with pain.    It is VERY IMPORTANT that you fill your prescription and take all of the antibiotics as directed. If a lower urinary tract infection goes untreated for too long, it can become a kidney infection.    FOR WOMEN: To reduce the risk of getting cystitis again:   Always urinate before and after sexual intercourse.   Always wipe from front to back after urinating or having a bowel  movement. Do not wipe from back to front.   Drink plenty of fluids. Try to drink cranberry or blueberry juice. These juices have a chemical that stops bacteria from "sticking" to the bladder.    YOU SHOULD SEEK MEDICAL ATTENTION IMMEDIATELY, EITHER HERE OR AT THE NEAREST EMERGENCY DEPARTMENT, IF ANY OF THE FOLLOWING OCCURS:   You have a fever (temperature higher than 100.58F / 38C) or shaking chills.   You feel nauseated or vomit.   You have pain in your side or back.   You don't get better after taking all of your antibiotics.   You have any new symptoms or concerns.   You feel worse or do not improve.

## 2016-02-24 NOTE — ED Provider Notes (Signed)
EMERGENCY DEPARTMENT HISTORY AND PHYSICAL EXAM     Physician/Midlevel provider first contact with patient: 02/24/16 1015         Date: 02/24/2016  Patient Name: Debra Norman      Provider Assessment       Provider Assessment: 35 y.o. female with fatigue, concern about anemia.  More likely with acute UTI. Stop iron, start antibiotics, follow up with PMD for further evaluation especially if symptoms persist. I have considered meningitis, stroke, ACS, PE, dissection, surgical process, but these seem much less likely and I will not pursue them further today.      History of Presenting Illness     History Provided By: Patient and Patient's Husband    Preferred Language: English     Chief Complaint: Fatigue  Onset: 2 days ago  Timing: Gradual  Location: Global  Quality: Tired  Severity: Moderate  Modifying Factors: None  Associated Symptoms: Urine frequency, vomiting after taking iron  Pertinent Negatives: Chest pain, trouble breathing, dizziness, shortness of breath.    Additional History: Debra Norman is a 35 y.o. female presenting to the ED with fatigue. The patient was in her usual state of health until one week ago when she had a panic attack. She saw her PMD who rx'ed lorazepam. She has been fatigued for two days with some urinary frequency, no fever, no chest pain, no shortness of breath.  She is concerned about low iron level and so started taking iron tablets but then vomited after the tablets.    PCP: Francesco Sor, MD      No current facility-administered medications for this encounter.     Current Outpatient Prescriptions   Medication Sig Dispense Refill   . ferrous sulfate 325 (65 FE) MG tablet Take 325 mg by mouth every morning with breakfast.     . LORazepam (ATIVAN) 0.5 MG tablet Take 0.5 mg by mouth every 6 (six) hours as needed for Anxiety.     . nitrofurantoin, macrocrystal-monohydrate, (MACROBID) 100 MG capsule Take 1 capsule (100 mg total) by mouth 2 (two) times daily. 14 capsule 0       Past History      Past Medical History:  Past Medical History   Diagnosis Date   . Anemia    . Anxiety    . Cyst of uterus      told "not a problem"       Past Surgical History:  History reviewed. No pertinent past surgical history.    Family History:  No family history on file.    Social History:  Social History   Substance Use Topics   . Smoking status: Never Smoker    . Smokeless tobacco: None   . Alcohol Use: No       Allergies:  No Known Allergies    Review of Systems     Review of Systems   Constitutional: Positive for fatigue. Negative for fever and activity change.   HENT: Negative for trouble swallowing.    Eyes: Negative for discharge.   Respiratory: Negative for cough and shortness of breath.    Cardiovascular: Negative for chest pain.   Gastrointestinal: Negative for nausea, vomiting and abdominal pain.   Genitourinary: Positive for frequency. Negative for dysuria.   Musculoskeletal: Negative for neck pain.   Skin: Negative for rash.   Neurological: Negative for headaches.   Psychiatric/Behavioral: Negative for confusion.         Physical Exam   BP 116/75 mmHg  Pulse 88  Temp(Src) 97 F (36.1 C) (Oral)  Resp 12  Ht 5\' 4"  (1.626 m)  Wt 59.421 kg  BMI 22.47 kg/m2  SpO2 97%  LMP 02/03/2016    Physical Examination:    General appearance - Well appearing, not in distress  Eyes - Pupils equal, extraocular eye movements grossly intact  ENT - Dry mucous membranes, OP normal  Neck - Supple, grossly normal ROM  Heart - Normal rate, regular rhythm  Chest - Normal work of breathing, lungs clear to auscultation bilaterally  Abdomen - Soft, nontender, non-distended, no CVA tenderness  Neurological - Alert, moves all extremities, CN II-XII normal, normal strength and sensation, normal finger-to-nose, no pronator drift, normal gait.  Psychiatric - Grossly oriented, normal insight  Musculoskeletal - No deformity or tenderness  Extremities - No pedal edema, distal blood flow grossly normal  Skin - Normal coloration, no rashes  where visualized       Diagnostic Study Results     Labs -     Results     Procedure Component Value Units Date/Time    Comprehensive metabolic panel [[id number]]  (Abnormal) Collected:  02/24/16 1054    Specimen Information:  Blood Updated:  02/24/16 1131     Glucose 98 mg/dL      BUN 9.0 mg/dL      Creatinine 0.7 mg/dL      Sodium 469 mEq/L      Potassium 3.9 mEq/L      Chloride 108 mEq/L      CO2 20 (L) mEq/L      Calcium 9.6 mg/dL      Protein, Total 7.7 g/dL      Albumin 4.2 g/dL      AST (SGOT) 14 U/L      ALT 9 U/L      Alkaline Phosphatase 49 U/L      Bilirubin, Total 0.8 mg/dL      Globulin 3.5 g/dL      Albumin/Globulin Ratio 1.2      Anion Gap 12.0     GFR [[id number]] Collected:  02/24/16 1054     EGFR >60.0 Updated:  02/24/16 1131    UA with reflex to micro (pts  3 + yrs) [[id number]]  (Abnormal) Collected:  02/24/16 1051    Specimen Information:  Urine Updated:  02/24/16 1116     Urine Type Clean Catch      Color, UA Yellow      Clarity, UA Sl Cloudy (A)      Specific Gravity UA 1.005      Urine pH 7.0      Leukocyte Esterase, UA Small (A)      Nitrite, UA Negative      Protein, UR 30 (A)      Glucose, UA Negative      Ketones UA 25 (A)      Urobilinogen, UA Normal mg/dL      Bilirubin, UA Negative      Blood, UA Negative      RBC, UA 0-2 /hpf      WBC, UA 6-10 (A) /hpf      Squamous Epithelial Cells, Urine 0-5 /hpf     Urine HCG, Qualitative [[id number]] Collected:  02/24/16 1051    Specimen Information:  Urine Updated:  02/24/16 1111     Urine HCG Qualitative Negative     CBC with differential [[id number]]  (Abnormal) Collected:  02/24/16 1054    Specimen Information:  Blood from Blood Updated:  02/24/16 1100  WBC 4.68 x10 3/uL      Hgb 12.4 g/dL      Hematocrit 16.1 %      Platelets 257 x10 3/uL      RBC 4.50 x10 6/uL      MCV 83.6 fL      MCH 27.6 (L) pg      MCHC 33.0 g/dL      RDW 14 %      MPV 10.9 fL      Neutrophils 67.5 %      Lymphocytes Automated 23.1 %      Monocytes 7.9 %      Eosinophils  Automated 1.1 %      Basophils Automated 0.4 %      Immature Granulocyte Unmeasured %      Neutrophils Absolute 3.16 x10 3/uL      Abs Lymph Automated 1.08 x10 3/uL      Abs Mono Automated 0.37 x10 3/uL      Abs Eos Automated 0.05 x10 3/uL      Absolute Baso Automated 0.02 x10 3/uL      Absolute Immature Granulocyte Unmeasured x10 3/uL           Radiologic Studies -   Radiology Results (24 Hour)     ** No results found for the last 24 hours. **      .    Medical Decision Making   I am the first provider for this patient.    I reviewed the vital signs, available nursing notes, past medical history, past surgical history, family history and social history.    Vital Signs-Reviewed the patient's vital signs.     Patient Vitals for the past 12 hrs:   BP Temp Pulse Resp   02/24/16 1228 116/75 mmHg 97 F (36.1 C) 88 12   02/24/16 1021 118/79 mmHg 99.2 F (37.3 C) 93 14         Diagnosis     Clinical Impression:   1. Other fatigue    2. Acute UTI        Treatment Plan:   ED Disposition     Discharge [place] discharge to home/self care.    Condition at disposition: Stable              _______________________________      This note is prepared by Charlott Holler, MD.      _______________________________        Theresia Bough, MD  02/24/16 1345

## 2016-02-24 NOTE — ED Notes (Addendum)
States feels weak and tired, has been losing some weight without trying. (Thinks lost 5lbs). States normally weighs 132. Today 131 actual. Saw PMD 1 week ago for same symptoms. No bloodwork was done, today would like a blood work-up. States had anemia when was pregnant. Started taking OTC iron two days ago. Some headaches, but not taking any medications.

## 2016-02-24 NOTE — ED Notes (Signed)
Patient is resting comfortably. Family at bedside; call button at hand

## 2016-02-24 NOTE — ED Notes (Signed)
MD back at bedside discussing plan of care.

## 2016-02-27 ENCOUNTER — Emergency Department
Admission: EM | Admit: 2016-02-27 | Discharge: 2016-02-27 | Disposition: A | Discharge status: Home / Self Care | Payer: No Typology Code available for payment source | Attending: Emergency Medicine | Admitting: Emergency Medicine

## 2016-02-27 ENCOUNTER — Emergency Department: Payer: No Typology Code available for payment source

## 2016-02-27 DIAGNOSIS — Z87440 Personal history of urinary (tract) infections: Secondary | ICD-10-CM | POA: Insufficient documentation

## 2016-02-27 DIAGNOSIS — Z538 Procedure and treatment not carried out for other reasons: Secondary | ICD-10-CM | POA: Insufficient documentation

## 2016-02-27 DIAGNOSIS — R110 Nausea: Secondary | ICD-10-CM | POA: Insufficient documentation

## 2016-02-27 DIAGNOSIS — R197 Diarrhea, unspecified: Secondary | ICD-10-CM | POA: Insufficient documentation

## 2016-02-27 LAB — POCT PREGNANCY TEST, URINE HCG: POCT Pregnancy HCG Test, UR: NEGATIVE

## 2016-02-27 LAB — URINALYSIS, REFLEX TO MICROSCOPIC EXAM IF INDICATED
Bilirubin, UA: NEGATIVE
Blood, UA: NEGATIVE
Glucose, UA: NEGATIVE
Ketones UA: 5 — AB
Leukocyte Esterase, UA: NEGATIVE
Nitrite, UA: NEGATIVE
Protein, UR: NEGATIVE
Specific Gravity UA: 1.004 (ref 1.001–1.035)
Urine pH: 7 (ref 5.0–8.0)
Urobilinogen, UA: NORMAL mg/dL

## 2016-02-27 LAB — GLUCOSE WHOLE BLOOD - POCT: Whole Blood Glucose POCT: 108 mg/dL — ABNORMAL HIGH (ref 70–100)

## 2016-02-27 NOTE — ED Notes (Signed)
Lal, MD at bedside.

## 2016-02-27 NOTE — ED Notes (Signed)
Bed: N 36  Expected date:   Expected time:   Means of arrival:   Comments:  [phone]

## 2016-02-27 NOTE — ED Provider Notes (Signed)
Physician/Midlevel provider first contact with patient: 02/27/16 0410                 [facility] EMERGENCY DEPARTMENT ATTENDING PHYSICIAN NOTE     CLINICAL SUMMARY          Diagnosis:    .     Final diagnoses:   Diarrhea in adult patient         Disposition:       ED Disposition     Discharge Debra Norman discharge to home/self care.    Condition at disposition: Stable               Discharge Prescriptions     None                                            CLINICAL INFORMATION        HPI:       35 y.o. female h/o anxiety; BIBA c/o nausea, diarrhea, and "shaking" onset shortly after taking Zoloft at 2200 last night (02/26/2016). States that she was recently prescribed Zoloft for her anxiety/panic attacks, took first dose yesterday and believes sxs are related as an adverse reaction. No abd pain, vomiting, no urinary symptoms. Of note, pt is taking macrobid for a recent UTI.     History obtained from: Patient      ROS:      All systems are negative except as noted in the HPI.      Physical Exam:      Pulse 87  BP 122/80 mmHg  Resp 18  SpO2 100 %  Temp 98.6 F (37 C)    Nursing note and vitals reviewed.    Constitutional:  Comfortable. Full sentences.   Psych:  Normal affect. Cooperative.   Eyes: No conjunctival injection. No discharge.  Ears, Nose, Mouth and Throat:Tolerating secretions.  No hoarseness.  Respiratory: No respiratory distress. No retractions.    Cardiovascular:   Abdomen: Soft. Non-tender. No distension. No peritoneal signs.   Musculoskeletal:           Neck:           Back:  No CVA tenderness b/l.           Upper Extremity:           Lower Extremity:   Neurological:   Integumentary:   GU:   Lymphatic:              PAST HISTORY        Primary Care Provider: Francesco Sor, MD        PMH/PSH:      Debra Norman has a past medical history of Anemia; Anxiety; and Cyst of uterus.  She has past surgical history that includes Tubal ligation.      Social/Family History:      She reports that she has  never smoked. She does not have any smokeless tobacco history on file. She reports that she does not drink alcohol or use illicit drugs.  Her family history is not on file.      Listed Medications on Arrival:    .     Home Medications     Last Medication Reconciliation Action:  Complete Radene Journey, RN 02/27/2016  4:09 AM                  nitrofurantoin, macrocrystal-monohydrate, (MACROBID)  100 MG capsule     Take 1 capsule (100 mg total) by mouth 2 (two) times daily.     sertraline (ZOLOFT) 50 MG tablet     Take 50 mg by mouth daily.           Flagged for Removal             ferrous sulfate 325 (65 FE) MG tablet     Take 325 mg by mouth every morning with breakfast.     LORazepam (ATIVAN) 0.5 MG tablet     Take 0.5 mg by mouth every 6 (six) hours as needed for Anxiety.        Allergies: She has No Known Allergies.            VISIT INFORMATION        Clinical Course & Medical Decision Making:      At 4:07AM:  35 y.o. female p/w some loose stools and "shaking" shortly after taking her first dose of Zoloft as described above. In ER, pt is afebrile, satting well on RA and hemodynamically normal. Abdomen is soft and benign.  NPO. Re-eval.     At 5:29 AM:  HCG negative  Pt has no abdominal pain  Abdomen remains soft and non-tender  Pt tolerating PO at this time (powerade & ginger ale)  Will d/c  ER return instructions given (see DCI for particulars)  Pt and her husband a/w plan.         Medications Given in the ED:    .     ED Medication Orders     None            Procedures:      Procedures      Interpretations:                  RESULTS        Lab Results:      Results     Procedure Component Value Units Date/Time    UA, Reflex to Microscopic (pts  3 + yrs) [[id number]]  (Abnormal) Collected:  02/27/16 0432    Specimen Information:  Urine Updated:  02/27/16 0531     Urine Type Clean Catch      Color, UA Straw      Clarity, UA Clear      Specific Gravity UA 1.004      Urine pH 7.0      Leukocyte Esterase, UA  Negative      Nitrite, UA Negative      Protein, UR Negative      Glucose, UA Negative      Ketones UA 5 (A)      Urobilinogen, UA Normal mg/dL      Bilirubin, UA Negative      Blood, UA Negative      RBC, UA 0 - 2 /hpf      WBC, UA 0 - 5 /hpf      Squamous Epithelial Cells, Urine 0 - 5 /hpf     Glucose Whole Blood - POCT [[id number]]  (Abnormal) Collected:  02/27/16 0434     POCT - Glucose Whole blood 108 (H) mg/dL Updated:  08/65/78 4696    Urine HCG, POC/ Qualitative [[id number]] Collected:  02/27/16 0425    Specimen Information:  Urine Updated:  02/27/16 0436     POCT QC Pass      POCT Pregnancy HCG Test, UR Negative      Comment:  Result:        Negative Value is Normal in Healthy Males or Healthy non-pregnant Females              Radiology Results:      No orders to display               Scribe Attestation:      Careers adviser  I was acting as a Neurosurgeon for Delorse Lek, MD on [employer]  Treatment Team: Scribe: Valrie Hart    I am the first provider for this patient and I personally performed the services documented. Treatment Team: Scribe: Valrie Hart is scribing for me on [place]. This note accurately reflects work and decisions made by me.  Delorse Lek, MD             Delorse Lek, MD  02/27/16 [phone]

## 2016-02-27 NOTE — Discharge Instructions (Signed)
Dear Debra Norman:    Thank you for choosing the [facility] Emergency Department, the premier emergency department in the [place] area.  I hope your visit today was EXCELLENT.    Specific instructions for your visit today:    Stop taking your Zoloft.  See your primary care physician in the next 2 days for reevaluation.    Diarrhea    You have been diagnosed with diarrhea.    You have diarrhea when you have stools (bowel movements) that are soft or liquid, or when you have too many stools in a day. You may also have stomach cramps/ pains, nausea (feeling sick to your stomach), vomiting and fever (temperature higher than 100.72F / 38C).    Diarrhea can be caused by bacteria, viruses and parasites. People sometimes get "traveler s diarrhea." They get it when they go to other countries. It is caused by bacteria.    People with diarrhea often lose a lot of body fluids. This causes dehydration. Drink a lot of water or other fluids to stay hydrated. You may also be given medicine for your diarrhea. It will slow the diarrhea and stop the vomiting (if you have this symptom).    You should drink lots of natural juices or a sports-type drink that has electrolytes (sodium, potassium, etc). Do not drink a lot of plain water. Sugary drinks like apple and pear juice might make the diarrhea worse.    You might be given medicine to help you with your diarrhea, nausea (feeling sick), vomiting and stomach cramps. This will depend on your age, medical history and symptoms.    It is OK for you to go home today.    Good hygiene (keeping clean) helps to keep the problem from spreading. Please wash your hands often, using soap and water, especially after using the bathroom. Do not prepare food or share food, drinks or utensils (forks, knives, etc.) with other people.    It is very important that you follow up with your regular doctor or a specialist. The doctor will tell you how soon this follow-up needs to be.    YOU  SHOULD SEEK MEDICAL ATTENTION IMMEDIATELY, EITHER HERE OR AT THE NEAREST EMERGENCY DEPARTMENT, IF ANY OF THE FOLLOWING OCCURS:   You are vomiting and cannot keep down fluids.   There is blood, pus or mucous in your stool.   You have symptoms of dehydration. These include dry mouth, not urinating (peeing) at least once every eight hours, feeling dizzy/lightheaded, severe weakness or passing out.      If you do not continue to improve or your condition worsens, please contact your doctor or return immediately to the Emergency Department.    Sincerely,  Delorse Lek, MD  Attending Emergency Physician  [facility] Emergency Department      ONSITE PHARMACY  Our full service onsite pharmacy is located in the ER waiting room.  Open 7 days a week from 9 am to 11 pm.  We accept all major insurances and prices are competitive with major retailers.  Ask your provider to print your prescriptions down to the pharmacy to speed you on your way home.    OBTAINING A PRIMARY CARE APPOINTMENT    Primary care physicians are either internists or family medicine doctors. Both types of PCPs focus on health promotion, disease prevention, patient education and counseling, and treatment of acute and chronic medical conditions.    Call for an appointment with a primary care doctor.  Ask  to see who is taking new patients.     [facility]  telephone:  [phone]  [email or web address]    DOCTOR REFERRALS  Call [phone] (available 24 hours a day, 7 days a week) if you need any further referrals and we can help you find a primary care doctor or specialist.  Also, available online at:  [email or web address]    YOUR CONTACT INFORMATION  Before leaving please check with registration to make sure we have an up-to-date contact number.  You can call registration at [phone] to update your information.  For questions about your hospital bill, please call [phone].  For questions about your Emergency  Dept Physician bill please call [phone].      FREE HEALTH SERVICES  If you need help with health or social services, please call 2-1-1 for a free referral to resources in your area.  2-1-1 is a free service connecting people with information on health insurance, free clinics, pregnancy, mental health, dental care, food assistance, housing, and substance abuse counseling.  Also, available online at:  http://www.[place].org    MEDICAL RECORDS AND TESTS  Certain laboratory test results do not come back the same day, for example urine cultures.   We will contact you if other important findings are noted.  Radiology films are often reviewed again to ensure accuracy.  If there is any discrepancy, we will notify you.      Please call [phone] to pick up a complimentary CD of any radiology studies performed.  If you or your doctor would like to request a copy of your medical records, please call [phone].      ORTHOPEDIC INJURY   Please know that significant injuries can exist even when an initial x-ray is read as normal or negative.  This can occur because some fractures (broken bones) are not initially visible on x-rays.  For this reason, close outpatient follow-up with your primary care doctor or bone specialist (orthopedist) is required.    MEDICATIONS AND FOLLOWUP  Please be aware that some prescription medications can cause drowsiness.  Use caution when driving or operating machinery.    The examination and treatment you have received in our Emergency Department is provided on an emergency basis, and is not intended to be a substitute for your primary care physician.  It is important that your doctor checks you again and that you report any new or remaining problems at that time.      24 HOUR PHARMACIES  The nearest 24 hour pharmacy is:    [facility]  [address]  [place], [place] [address]  [phone]      ASSISTANCE WITH INSURANCE    Affordable Care Act  [facility])  Call to start  or finish an application, compare plans, enroll or ask a question.  [phone]  TTY: [phone]  Web:  Healthcare.gov    Help Enrolling in [facility]  [employer] [place]  7378459286 (TOLL-FREE)  604-179-9170 (TTY)  Web:  Http://www.coverva.org    Local Help Enrolling in the [facility]  [place] [facility]  469-020-7467 (MAIN)  Email:  health-help@nvfs .org  Web:  BlackjackMyths.is  Address:  [address], [address] [place], [place] [address]    SEDATING MEDICATIONS  Sedating medications include strong pain medications (e.g. narcotics), muscle relaxers, benzodiazepines (used for anxiety and as muscle relaxers), Benadryl/diphenhydramine and other antihistamines for allergic reactions/itching, and other medications.  If you are unsure if you have received a sedating medication, please ask your physician  or nurse.  If you received a sedating medication: DO NOT drive a car. DO NOT operate machinery. DO NOT perform jobs where you need to be alert.  DO NOT drink alcoholic beverages while taking this medicine.     If you get dizzy, sit or lie down at the first signs. Be careful going up and down stairs.  Be extra careful to prevent falls.     Never give this medicine to others.     Keep this medicine out of reach of children.     Do not take or save old medicines. Throw them away when outdated.     Keep all medicines in a cool, dry place. DO NOT keep them in your bathroom medicine cabinet or in a cabinet above the stove.    MEDICATION REFILLS  Please be aware that we cannot refill any prescriptions through the ER. If you need further treatment from what is provided at your ER visit, please follow up with your primary care doctor or your pain management specialist.    FREESTANDING EMERGENCY DEPARTMENTS OF [facility]  Did you know [facility] has two freestanding ERs located just a few miles away?  [facility] [place] and [facility] [place] have short wait times, easy free parking  directly in front of the building and top patient satisfaction scores - and the same Board Certified Emergency Medicine doctors as [facility].              Hadlie Gipson  [id number]  [phone]  [phone]  02/27/2016    Discharge Instructions    As always, you are the most important factor in your recovery.  Please follow these instructions carefully.  If you have problems that we have not discussed, CALL OR VISIT YOUR DOCTOR RIGHT AWAY.     If you can't reach your doctor, return to the emergency department.    I Debra Norman understand the written and discussed instructions.  My questions have been answered.  I acknowledge receipt of these instructions.     Patient or responsible person:         Patient's Signature               Physician or Nurse

## 2016-02-28 ENCOUNTER — Emergency Department (HOSPITAL_COMMUNITY)
Admission: EM | Admit: 2016-02-28 | Discharge: 2016-02-28 | Disposition: A | Discharge status: Home / Self Care | Payer: 59 | Attending: Emergency Medicine | Admitting: Emergency Medicine

## 2016-02-28 ENCOUNTER — Encounter (HOSPITAL_COMMUNITY): Payer: Self-pay

## 2016-02-28 DIAGNOSIS — F411 Generalized anxiety disorder: Secondary | ICD-10-CM

## 2016-02-28 DIAGNOSIS — R197 Diarrhea, unspecified: Secondary | ICD-10-CM | POA: Diagnosis not present

## 2016-02-28 DIAGNOSIS — F430 Acute stress reaction: Secondary | ICD-10-CM | POA: Insufficient documentation

## 2016-02-28 DIAGNOSIS — F419 Anxiety disorder, unspecified: Secondary | ICD-10-CM | POA: Diagnosis present

## 2016-02-28 HISTORY — DX: Anxiety disorder, unspecified: F41.9

## 2016-02-28 LAB — I-STAT BETA HCG BLOOD, ED (MC, WL, AP ONLY): I-stat hCG, quantitative: <5 m[IU]/mL (ref <5)

## 2016-02-28 LAB — COMPREHENSIVE METABOLIC PANEL
ALT: 13 U/L — AB (ref 14–54)
ANION GAP: 12 (ref 5–15)
AST: 15 U/L (ref 15–41)
Albumin: 4.4 g/dL (ref 3.5–5.0)
Alkaline Phosphatase: 50 U/L (ref 38–126)
BUN: 5 mg/dL — ABNORMAL LOW (ref 6–20)
CHLORIDE: 105 mmol/L (ref 101–111)
CO2: 19 mmol/L — AB (ref 22–32)
Calcium: 10 mg/dL (ref 8.9–10.3)
Creatinine, Ser: 0.74 mg/dL (ref 0.44–1.00)
Glucose, Bld: 90 mg/dL (ref 65–99)
POTASSIUM: 3.2 mmol/L — AB (ref 3.5–5.1)
SODIUM: 136 mmol/L (ref 135–145)
Total Bilirubin: 1.4 mg/dL — ABNORMAL HIGH (ref 0.3–1.2)
Total Protein: 8.2 g/dL — ABNORMAL HIGH (ref 6.5–8.1)

## 2016-02-28 LAB — CBC
HCT: 39.4 % (ref 36.0–46.0)
HEMOGLOBIN: 13 g/dL (ref 12.0–15.0)
MCH: 27.3 pg (ref 26.0–34.0)
MCHC: 33 g/dL (ref 30.0–36.0)
MCV: 82.6 fL (ref 78.0–100.0)
Platelets: 329 10*3/uL (ref 150–400)
RBC: 4.77 MIL/uL (ref 3.87–5.11)
RDW: 13.7 % (ref 11.5–15.5)
WBC: 6.7 10*3/uL (ref 4.0–10.5)

## 2016-02-28 LAB — LIPASE, BLOOD: LIPASE: 28 U/L (ref 11–51)

## 2016-02-28 MED ORDER — LORAZEPAM 1 MG PO TABS: 1.0000 mg | ORAL_TABLET | Freq: Three times a day (TID) | ORAL | Status: DC | PRN

## 2016-02-28 MED ORDER — LORAZEPAM 1 MG PO TABS
1.0000 mg | ORAL_TABLET | Freq: Once | ORAL | Status: AC
  Administered 2016-02-28: 1 mg via ORAL
  Filled 2016-02-28: qty 1

## 2016-02-28 MED ORDER — POTASSIUM CHLORIDE CRYS ER 20 MEQ PO TBCR
40.0000 meq | EXTENDED_RELEASE_TABLET | Freq: Once | ORAL | Status: AC
  Administered 2016-02-28: 40 meq via ORAL
  Filled 2016-02-28: qty 2

## 2016-02-28 NOTE — ED Notes (Signed)
Patient has history of anxiety and panic attacks and today developed diarrhea, here for evaluation of same. NAD. Complains of decreased appetite and feeling weak

## 2016-02-28 NOTE — ED Provider Notes (Signed)
CSN: [id number]     Arrival date & time 02/28/16  20 History   First MD Initiated Contact with Patient 02/28/16 2009     Chief Complaint  Patient presents with  . Anxiety  . Diarrhea     (Consider location/radiation/quality/duration/timing/severity/associated sxs/prior Treatment) HPI Comments: 35 year old female with a history of anxiety presents to the emergency department for evaluation of worsening anxiety. Patient states that her symptoms began to significantly worsen 2 days ago. She saw her psychiatrist in [place] who started her on Zoloft. She took her first tablet before bed on 02/26/2016. She states that she awoke hyperventilating with chest tightness and tingling in her extremities. She presented to an ED in [place] who managed her symptoms. Patient was discharged yesterday and was brought to [facility] by her parents, as her parents reside here. Since yesterday, patient has continued to have worsening thanks. She has had difficulty sleeping and reports only sleeping 2 hours in the past 48 hours. She has had some persistent loose stool which began after taking zoloft. No CP, SOB, abdominal pain, melena, hematochezia, or fever currently. Patient states that she has no other medication to take for anxiety. She has f/u with her psychiatrist on 03/18/16  Patient is a 35 y.o. female presenting with anxiety and diarrhea. The history is provided by the patient. No language interpreter was used.  Anxiety Pertinent negatives include no abdominal pain, chest pain, fever or vomiting.  Diarrhea Associated symptoms: no abdominal pain, no fever and no vomiting     Past Medical History  Diagnosis Date  . Medical history non-contributory   . Anxiety    Past Surgical History  Procedure Laterality Date  . No past surgeries    . Tubal ligation Bilateral 07/09/2015    Procedure: POST PARTUM TUBAL LIGATION;  Surgeon: Olga Millers, MD;  Location: [facility];  Service: Gynecology;  Laterality:  Bilateral;   Family History  Problem Relation Age of Onset  . Alcohol abuse Neg Hx    Social History  Substance Use Topics  . Smoking status: Never Smoker   . Smokeless tobacco: None  . Alcohol Use: No   OB History    Gravida Para Term Preterm AB TAB SAB Ectopic Multiple Living   4 4 4  0 0 0 0 0 0 4      Review of Systems  Constitutional: Negative for fever.  Respiratory: Negative for shortness of breath.   Cardiovascular: Negative for chest pain.  Gastrointestinal: Positive for diarrhea. Negative for vomiting, abdominal pain and blood in stool.  Psychiatric/Behavioral: The patient is nervous/anxious.   All other systems reviewed and are negative.   Allergies  Review of patient's allergies indicates no known allergies.  Home Medications   Prior to Admission medications   Medication Sig Start Date End Date Taking? Authorizing Provider  benzonatate (TESSALON) 100 MG capsule Take 1 capsule (100 mg total) by mouth every 8 (eight) hours. 10/11/15   Dorie Rank, MD  Diphenhydramine-PE-APAP (THERAFLU WARMING RELIEF FLU) 12.5-5-325 MG/15ML LIQD Take 15 mLs by mouth 2 (two) times daily.    Historical Provider, MD  propranolol (INDERAL) 10 MG tablet Take 1/2 to 1 tablet 45 minutes before driving. No more than 1 tablet every 8 hours. 12/13/15   Janne Napoleon, NP   BP 117/85 mmHg  Pulse 79  Temp(Src) 98.9 F (37.2 C) (Oral)  Resp 18  SpO2 98%  LMP 02/28/2016   Physical Exam  Constitutional: She is oriented to person, place, and time. She appears  well-developed and well-nourished. No distress.  Nontoxic appearing  HENT:  Head: Normocephalic and atraumatic.  Eyes: Conjunctivae and EOM are normal. No scleral icterus.  Neck: Normal range of motion.  Cardiovascular: Normal rate, regular rhythm and intact distal pulses.   Pulmonary/Chest: Effort normal. No respiratory distress. She has no wheezes. She has no rales.  Respirations even and unlabored. Lungs clear to auscultation.   Abdominal: Soft. She exhibits no distension. There is no tenderness. There is no rebound.  Soft, nontender abdomen.  Musculoskeletal: Normal range of motion.  Neurological: She is alert and oriented to person, place, and time. She exhibits normal muscle tone. Coordination normal.  GCS 15. Patient moving all extremities.  Skin: Skin is warm and dry. No rash noted. She is not diaphoretic. No erythema. No pallor.  Psychiatric: Her mood appears anxious. She is withdrawn.  Nursing note and vitals reviewed.   ED Course  Procedures (including critical care time) Labs Review Labs Reviewed  COMPREHENSIVE METABOLIC PANEL - Abnormal; Notable for the following:    Potassium 3.2 (*)    CO2 19 (*)    BUN 5 (*)    Total Protein 8.2 (*)    ALT 13 (*)    Total Bilirubin 1.4 (*)    All other components within normal limits  LIPASE, BLOOD  CBC  URINALYSIS, ROUTINE W REFLEX MICROSCOPIC (NOT AT [facility])  I-STAT BETA HCG BLOOD, ED ([facility], [facility], [facility] ONLY)    Imaging Review No results found.   I have personally reviewed and evaluated these images and lab results as part of my medical decision-making.   EKG Interpretation None      9:22 PM Patient to be given ativan. Labs reassuring. Normal anion gap metabolic acidosis likely secondary to GI losses/diarrhea. No leukocytosis. Will also give Kdur for mild hypokalemia. MDM   Final diagnoses:  Anxiety in acute stress reaction    Patient is a 35 y/o female who presents for worsening anxiety. She was placed on Zoloft 2 days ago, but had an adverse reaction to this medication yesterday and suffered a panic attack causing her to be treated in the emergency department at a hospital in [place]. She was discharged yesterday and brought [facility] by her parents. Patient with no panic attack while in the emergency department, she does appear anxious. I believe her anxiety has been aggravated by her lack of sleep as well as her lack of medication as she has not  been taking her Zoloft and has no medication to take as needed for breakthrough/worsening anxiety.  Hypokalemia repleted with oral potassium. Patient does have slightly low bicarbonate with a normal anion gap. This may be secondary to her 2 days of diarrhea. Patient does not appear clinically dehydrated and she has been tolerating oral fluids in the ED.   The patient has been treated with Ativan. On reassessment, she feels much calmer. She states that she is comfortable with discharge and outpatient follow-up with her psychiatrist. I have recommended that she try and move up this appointment as her follow-up is currently scheduled for 03/18/2016. Return precautions discussed and provided. Patient discharged with short course of Ativan, in satisfactory condition, with no unaddressed concerns.   Filed Vitals:   02/28/16 2045 02/28/16 2115 02/28/16 2130 02/28/16 2215  BP: 117/85 132/86 126/87 133/73  Pulse: 79 99 96 80  Temp:      TempSrc:      Resp: 18   16  SpO2: 98% 99% 98% 97%     Claiborne Billings  Tama Headings 02/28/16 Whitman Liu, MD 02/29/16 [phone]

## 2016-02-28 NOTE — Discharge Instructions (Signed)
Panic Attacks Panic attacks are sudden, short-livedsurges of severe anxiety, fear, or discomfort. They may occur for no reason when you are relaxed, when you are anxious, or when you are sleeping. Panic attacks may occur for a number of reasons:   Healthy people occasionally have panic attacks in extreme, life-threatening situations, such as war or natural disasters. Normal anxiety is a protective mechanism of the body that helps [place] react to danger (fight or flight response).  Panic attacks are often seen with anxiety disorders, such as panic disorder, social anxiety disorder, generalized anxiety disorder, and phobias. Anxiety disorders cause excessive or uncontrollable anxiety. They may interfere with your relationships or other life activities.  Panic attacks are sometimes seen with other mental illnesses, such as depression and posttraumatic stress disorder.  Certain medical conditions, prescription medicines, and drugs of abuse can cause panic attacks. SYMPTOMS  Panic attacks start suddenly, peak within 20 minutes, and are accompanied by four or more of the following symptoms:  Pounding heart or fast heart rate (palpitations).  Sweating.  Trembling or shaking.  Shortness of breath or feeling smothered.  Feeling choked.  Chest pain or discomfort.  Nausea or strange feeling in your stomach.  Dizziness, light-headedness, or feeling like you will faint.  Chills or hot flushes.  Numbness or tingling in your lips or hands and feet.  Feeling that things are not real or feeling that you are not yourself.  Fear of losing control or going crazy.  Fear of dying. Some of these symptoms can mimic serious medical conditions. For example, you may think you are having a heart attack. Although panic attacks can be very scary, they are not life threatening. DIAGNOSIS  Panic attacks are diagnosed through an assessment by your health care provider. Your health care provider will ask  questions about your symptoms, such as where and when they occurred. Your health care provider will also ask about your medical history and use of alcohol and drugs, including prescription medicines. Your health care provider may order blood tests or other studies to rule out a serious medical condition. Your health care provider may refer you to a mental health professional for further evaluation. TREATMENT   Most healthy people who have one or two panic attacks in an extreme, life-threatening situation will not require treatment.  The treatment for panic attacks associated with anxiety disorders or other mental illness typically involves counseling with a mental health professional, medicine, or a combination of both. Your health care provider will help determine what treatment is best for you.  Panic attacks due to physical illness usually go away with treatment of the illness. If prescription medicine is causing panic attacks, talk with your health care provider about stopping the medicine, decreasing the dose, or substituting another medicine.  Panic attacks due to alcohol or drug abuse go away with abstinence. Some adults need professional help in order to stop drinking or using drugs. HOME CARE INSTRUCTIONS   Take all medicines as directed by your health care provider.   Schedule and attend follow-up visits as directed by your health care provider. It is important to keep all your appointments. SEEK MEDICAL CARE IF:  You are not able to take your medicines as prescribed.  Your symptoms do not improve or get worse. SEEK IMMEDIATE MEDICAL CARE IF:   You experience panic attack symptoms that are different than your usual symptoms.  You have serious thoughts about hurting yourself or others.  You are taking medicine for panic attacks and  have a serious side effect. MAKE SURE YOU:  Understand these instructions.  Will watch your condition.  Will get help right away if you are not  doing well or get worse.   This information is not intended to replace advice given to you by your health care provider. Make sure you discuss any questions you have with your health care provider.   Document Released: 08/05/2005 Document Revised: 08/10/2013 Document Reviewed: 03/19/2013 Elsevier Interactive Patient Education 2016 [employer]  Generalized Anxiety Disorder Generalized anxiety disorder (GAD) is a mental disorder. It interferes with life functions, including relationships, work, and school. GAD is different from normal anxiety, which everyone experiences at some point in their lives in response to specific life events and activities. Normal anxiety actually helps [place] prepare for and get through these life events and activities. Normal anxiety goes away after the event or activity is over.  GAD causes anxiety that is not necessarily related to specific events or activities. It also causes excess anxiety in proportion to specific events or activities. The anxiety associated with GAD is also difficult to control. GAD can vary from mild to severe. People with severe GAD can have intense waves of anxiety with physical symptoms (panic attacks).  SYMPTOMS The anxiety and worry associated with GAD are difficult to control. This anxiety and worry are related to many life events and activities and also occur more days than not for 6 months or longer. People with GAD also have three or more of the following symptoms (one or more in children):  Restlessness.   Fatigue.  Difficulty concentrating.   Irritability.  Muscle tension.  Difficulty sleeping or unsatisfying sleep. DIAGNOSIS GAD is diagnosed through an assessment by your health care provider. Your health care provider will ask you questions aboutyour mood,physical symptoms, and events in your life. Your health care provider may ask you about your medical history and use of alcohol or drugs, including prescription medicines.  Your health care provider may also do a physical exam and blood tests. Certain medical conditions and the use of certain substances can cause symptoms similar to those associated with GAD. Your health care provider may refer you to a mental health specialist for further evaluation. TREATMENT The following therapies are usually used to treat GAD:   Medication. Antidepressant medication usually is prescribed for long-term daily control. Antianxiety medicines may be added in severe cases, especially when panic attacks occur.   Talk therapy (psychotherapy). Certain types of talk therapy can be helpful in treating GAD by providing support, education, and guidance. A form of talk therapy called cognitive behavioral therapy can teach you healthy ways to think about and react to daily life events and activities.  Stress managementtechniques. These include yoga, meditation, and exercise and can be very helpful when they are practiced regularly. A mental health specialist can help determine which treatment is best for you. Some people see improvement with one therapy. However, other people require a combination of therapies.   This information is not intended to replace advice given to you by your health care provider. Make sure you discuss any questions you have with your health care provider.   Document Released: 11/30/2012 Document Revised: 08/26/2014 Document Reviewed: 11/30/2012 Elsevier Interactive Patient Education [employer].

## 2016-02-28 NOTE — ED Notes (Signed)
Pt verbalized understanding of discharge instructions. Denies any other needs at this time.

## 2016-03-05 ENCOUNTER — Telehealth: Payer: Self-pay | Admitting: *Deleted

## 2016-04-21 ENCOUNTER — Emergency Department: Payer: No Typology Code available for payment source

## 2016-04-21 ENCOUNTER — Emergency Department
Admission: EM | Admit: 2016-04-21 | Discharge: 2016-04-21 | Disposition: A | Discharge status: Home / Self Care | Payer: No Typology Code available for payment source | Attending: Emergency Medicine | Admitting: Emergency Medicine

## 2016-04-21 DIAGNOSIS — R002 Palpitations: Secondary | ICD-10-CM | POA: Insufficient documentation

## 2016-04-21 LAB — CBC AND DIFFERENTIAL
Absolute NRBC: 0 10*3/uL
Basophils Absolute Automated: 0.05 10*3/uL (ref 0.00–0.20)
Basophils Automated: 0.9 %
Eosinophils Absolute Automated: 0.23 10*3/uL (ref 0.00–0.70)
Eosinophils Automated: 4.3 %
Hematocrit: 39.3 % (ref 37.0–47.0)
Hgb: 12.6 g/dL (ref 12.0–16.0)
Immature Granulocytes Absolute: 0.01 10*3/uL
Immature Granulocytes: 0.2 %
Lymphocytes Absolute Automated: 2.63 10*3/uL (ref 0.50–4.40)
Lymphocytes Automated: 48.8 %
MCH: 27.9 pg — ABNORMAL LOW (ref 28.0–32.0)
MCHC: 32.1 g/dL (ref 32.0–36.0)
MCV: 87.1 fL (ref 80.0–100.0)
MPV: 10.4 fL (ref 9.4–12.3)
Monocytes Absolute Automated: 0.44 10*3/uL (ref 0.00–1.20)
Monocytes: 8.2 %
Neutrophils Absolute: 2.03 10*3/uL (ref 1.80–8.10)
Neutrophils: 37.6 %
Nucleated RBC: 0 /100 WBC (ref 0.0–1.0)
Platelets: 226 10*3/uL (ref 140–400)
RBC: 4.51 10*6/uL (ref 4.20–5.40)
RDW: 15 % (ref 12–15)
WBC: 5.39 10*3/uL (ref 3.50–10.80)

## 2016-04-21 LAB — COMPREHENSIVE METABOLIC PANEL
ALT: 10 U/L (ref 0–55)
AST (SGOT): 14 U/L (ref 5–34)
Albumin/Globulin Ratio: 1.3 (ref 0.9–2.2)
Albumin: 4 g/dL (ref 3.5–5.0)
Alkaline Phosphatase: 49 U/L (ref 37–106)
Anion Gap: 11 (ref 5.0–15.0)
BUN: 10 mg/dL (ref 7.0–19.0)
Bilirubin, Total: 0.6 mg/dL (ref 0.2–1.2)
CO2: 24 mEq/L (ref 22–29)
Calcium: 9.7 mg/dL (ref 8.5–10.5)
Chloride: 105 mEq/L (ref 100–111)
Creatinine: 0.8 mg/dL (ref 0.6–1.0)
Globulin: 3.2 g/dL (ref 2.0–3.6)
Glucose: 116 mg/dL — ABNORMAL HIGH (ref 70–100)
Potassium: 3.9 mEq/L (ref 3.5–5.1)
Protein, Total: 7.2 g/dL (ref 6.0–8.3)
Sodium: 140 mEq/L (ref 136–145)

## 2016-04-21 LAB — GFR: EGFR: >60

## 2016-04-21 NOTE — ED Provider Notes (Signed)
EMERGENCY DEPARTMENT HISTORY AND PHYSICAL        Physician/Midlevel provider first contact with patient: 04/21/16 2231       Date: 04/21/2016  Patient Name: Debra Norman  Chief Complaint:   Chief Complaint   Patient presents with   . Palpitations       History of Presenting Illness           History Provided By: the patient.  Translation services  was not used.  Sign language services was not used.    HPI    Chief Complaint: Heart palpitations  Onset: Last night  Timing: Intermittent  Location: Cardiovascular  Quality: Beating fast   Severity: Moderate  Modifying Factors: None.   Associated Symptoms:  Tinnitus and wrist pain. Denies recent ASA usage, increased caffeine intake, alcohol usage, drug usage, chest pain, SOB, or abdominal pain.       Pt presents to the ED with two episodes of intermittent heart "fluttering" last night. The heart palpitations are described as beating fast. Pt also c/o 2 week hx of bilateral tinnitus and intermittent right wrist pain. She denies recent ASA usage, increased caffeine intake, alcohol usage, drug usage, chest pain, SOB, or abdominal pain.       PCP: Mikey Kirschner, MD    LMP: No LMP recorded.    GYN HX:   Obstetric History    G5   P4   T0   P0   A1   TAB0   SAB0   E0   M0   L0        Blood Type: B POS        Past History     Past Medical History:  Past Medical History   Diagnosis Date   . Anemia    . Anxiety    . Cyst of uterus      told "not a problem"           Past Surgical History:  Past Surgical History   Procedure Laterality Date   . Tubal ligation           Family History:  History reviewed. No pertinent family history.        Social History:  Social History   Substance Use Topics   . Smoking status: Never Smoker    . Smokeless tobacco: None   . Alcohol Use: No         Allergies:  No Known Allergies    No current facility-administered medications for this encounter.     Current Outpatient Prescriptions   Medication Sig Dispense Refill   . ferrous sulfate 325 (65  FE) MG tablet Take 325 mg by mouth every morning with breakfast.     . LORazepam (ATIVAN) 0.5 MG tablet Take 0.5 mg by mouth every 6 (six) hours as needed for Anxiety.     . sertraline (ZOLOFT) 50 MG tablet Take 50 mg by mouth daily.         Review of Systems     Review of Systems   Constitutional: Negative for fever, diaphoresis, activity change, appetite change and fatigue.   HENT: Positive for tinnitus. Negative for ear pain, rhinorrhea and sore throat.    Eyes: Negative for pain, discharge, redness and visual disturbance.   Respiratory: Negative for apnea, cough, chest tightness, shortness of breath, wheezing and stridor.    Cardiovascular: Positive for palpitations. Negative for chest pain and leg swelling.   Gastrointestinal: Negative for nausea, vomiting, abdominal pain, diarrhea, blood  in stool and abdominal distention.   Musculoskeletal: Negative for myalgias, arthralgias, neck pain and neck stiffness.        Positive for right wrist pain.    Skin: Negative for color change, pallor and rash.   Neurological: Negative for dizziness, seizures, syncope, speech difficulty, weakness, numbness and headaches.   Hematological: Does not bruise/bleed easily.       Physical Exam     Medication List Reviewed: Yes      BP 101/58 mmHg  Pulse 73  Temp(Src) 97.4 F (36.3 C)  Resp 18  Ht 5\' 4"  (1.626 m)  Wt 57.153 kg  BMI 21.62 kg/m2  SpO2 99%    Pulse Oximetry: 99%  (RA unless specified)    Physical Exam   Constitutional: She is oriented to person, place, and time. She appears well-developed and well-nourished. No distress.   HENT:   Head: Normocephalic and atraumatic.   Right Ear: Tympanic membrane and external ear normal.   Left Ear: Tympanic membrane and external ear normal.   Mouth/Throat: Oropharynx is clear and moist. No oropharyngeal exudate.   Eyes: Conjunctivae and EOM are normal. Pupils are equal, round, and reactive to light.   Neck: Normal range of motion. Neck supple. No JVD present.   Cardiovascular:  Normal rate, regular rhythm and normal heart sounds.    Pulmonary/Chest: Effort normal and breath sounds normal. No stridor. No respiratory distress. She has no wheezes. She has no rales. She exhibits no tenderness.   Abdominal: Soft. She exhibits no distension. There is no tenderness. There is no rebound and no guarding.   Musculoskeletal: Normal range of motion.   Neurological: She is alert and oriented to person, place, and time. No cranial nerve deficit.   Skin: Skin is warm and dry. No rash noted. She is not diaphoretic. No erythema. No pallor.   Psychiatric: She has a normal mood and affect. Her behavior is normal. Judgment and thought content normal.   Nursing note and vitals reviewed.      Diagnostic Study Results     Labs -     Results     Procedure Component Value Units Date/Time    GFR [[id number]] Collected:  04/21/16 2256     EGFR >60.0 Updated:  04/21/16 2328    Comprehensive metabolic panel [[id number]]  (Abnormal) Collected:  04/21/16 2256    Specimen Information:  Blood Updated:  04/21/16 2328     Glucose 116 (H) mg/dL      BUN 46.9 mg/dL      Creatinine 0.8 mg/dL      Sodium 629 mEq/L      Potassium 3.9 mEq/L      Chloride 105 mEq/L      CO2 24 mEq/L      Calcium 9.7 mg/dL      Protein, Total 7.2 g/dL      Albumin 4.0 g/dL      AST (SGOT) 14 U/L      ALT 10 U/L      Alkaline Phosphatase 49 U/L      Bilirubin, Total 0.6 mg/dL      Globulin 3.2 g/dL      Albumin/Globulin Ratio 1.3      Anion Gap 11.0     CBC with differential [[id number]]  (Abnormal) Collected:  04/21/16 2256    Specimen Information:  Blood from Blood Updated:  04/21/16 2300     WBC 5.39 x10 3/uL      Hgb 12.6 g/dL  Hematocrit 39.3 %      Platelets 226 x10 3/uL      RBC 4.51 x10 6/uL      MCV 87.1 fL      MCH 27.9 (L) pg      MCHC 32.1 g/dL      RDW 15 %      MPV 10.4 fL      Neutrophils 37.6 %      Lymphocytes Automated 48.8 %      Monocytes 8.2 %      Eosinophils Automated 4.3 %      Basophils Automated 0.9 %      Immature  Granulocyte 0.2 %      Nucleated RBC 0.0 /100 WBC      Neutrophils Absolute 2.03 x10 3/uL      Abs Lymph Automated 2.63 x10 3/uL      Abs Mono Automated 0.44 x10 3/uL      Abs Eos Automated 0.23 x10 3/uL      Absolute Baso Automated 0.05 x10 3/uL      Absolute Immature Granulocyte 0.01 x10 3/uL      Absolute NRBC 0.00 x10 3/uL     TSH [[id number]] Collected:  04/21/16 2256    Specimen Information:  Blood Updated:  04/21/16 2256              Radiologic Studies -   Radiology Results (24 Hour)     ** No results found for the last 24 hours. **          EKG/MONITOR    Current EKG:  ED MD EKG Reading : RSR at 76; normal axis; no blocks, conduction abnormalities, hypertrophy  or ischemic changes.  Impression: normal EKG.    Old EKG:   not reviewed or none available    Cardiac Monitor: ED MD Monitor Interpretation: Rate:74; Rhythm: RSR      Medical Decision Making   I am the first provider for this patient.    I reviewed the vital signs, available nursing notes, past medical history, past surgical history, family history and social history.      Records Review: None     ED Course:     10:38 PM - Discussed plan for blood work in ED. Pt is agreeable.     11:34 PM - Updated pt on lab results. Discussed f/u with cardiology, home self care, discharge instructions, and return precautions with patient. Possibility of evolving illness reviewed. All questions solicited and addressed. Patient states understanding and amenable to discharge.     Provider Notes:       Procedures: none      Core Measures:  none      Critical Care Time: none          Diagnosis     Clinical Impression:  Primary diagnosis is in boldface  1. Palpitations by history        DISPOSITION    discharged home     DISPOSITION CONDITION    stable    Vital Signs-.     Patient Vitals for the past 12 hrs:   BP Temp Pulse Resp   04/21/16 2340 101/58 mmHg - 73 18   04/21/16 2229 112/64 mmHg 97.4 F (36.3 C) 79 15              _______________________________    Attestations:    This note is prepared by Dillard Essex, acting as Scribe for Gaylord Shih, MD.     Gaylord Shih,  MD: The scribe's documentation has been prepared under my direction and personally reviewed by me in its entirety. I confirm that the note above accurately reflects all work, treatment, procedures, and medical decision making performed by me.    _______________________________      CHART RECONCILIATION: CHART OWNERSHIP: Dr. Gaylord Shih is the primary emergency physician of record.              Harless Litten, MD  04/22/16 [phone]

## 2016-04-21 NOTE — Discharge Instructions (Signed)
Please be aware that an emergency department diagnosis is often preliminary, and that often a definitive diagnosis cannot be made.  EVERY disease has a progression and may take time before it is recognizable.  It is EXTREMELY important that you return to the Emergency Department or see your own doctor if you do not improve or especially if your symptoms worsen or change. This is particularly true for those conditions which not uncommonly cannot be diagnosed with certainty in the ER and are potentially dangerous, such as chest pain, abdominal pain, headache and pediatric fever.    In short, DO NOT HESITATE TO RETURN TO THE EMERGENCY DEPARTMENT IF YOU SENSE THAT SOMETHING IS NOT RIGHT!    HEART PALPITATIONS    Palpitations refers to the feeling that your heart is beating hard, fast or irregular. Some people describe it as "pounding" or "skipped beats". Palpitations may occur in persons with heart disease, but can also occur in healthy persons.  Heart-related causes:   Arrhythmia (a change from the heart's normal rhythm)   Disease of the heart valves  Non-heart-related causes:   Certain medicines (such as asthma inhalers and decongestants)   Some herbal supplements, energy drinks and pills, and weight loss pills   Illegal stimulant drugs (such as cocaine, crank, methamphetamine, PCP)   Caffeine, alcohol and tobacco   Medical conditions such as thyroid disease, anemia, anxiety and panic disorder  Sometimes the cause cannot be found.  HOME CARE:  1. Avoid excess caffeine, alcohol, tobacco and any stimulant drugs.  2. Tell your doctor about any prescription or over-the-counter or herbal medicines you take.  FOLLOW UP with your doctor or as advised by our staff.  GET PROMPT MEDICAL ATTENTION if any of the following occur together with palpitations:   Weakness, dizziness, light-headed or fainting   Chest pain or shortness of breath   Rapid heart rate (over 120 beats per minute, at rest)   Palpitations that lasts  over 20 minutes   Weakness of an arm or leg or one side of the face   Difficulty with speech or vision   2000-2012 [employer], [address], [place] [address]. All rights reserved. This information is not intended as a substitute for professional medical care. Always follow your healthcare professional's instructions.    If the palpitations recur, remember to count your pulse for a full minute and also note whether your pulse is regular or irregular.  This will help your doctor determine the cause.  Return to the ER for weakness, chest pain, fatigue, lightheadedness, shortness of breath, passing out or any other concern.  Otherwise, follow-up with your primary care doctor or referral doctor as directed.

## 2016-04-22 LAB — TSH: TSH: 1.53 u[IU]/mL (ref 0.35–4.94)

## 2016-04-22 LAB — ECG 12-LEAD
Atrial Rate: 76 {beats}/min
P Axis: 69 degrees
P-R Interval: 144 ms
Q-T Interval: 380 ms
QRS Duration: 84 ms
QTC Calculation (Bezet): 427 ms
R Axis: 82 degrees
T Axis: 49 degrees
Ventricular Rate: 76 {beats}/min

## 2016-04-29 ENCOUNTER — Ambulatory Visit (INDEPENDENT_AMBULATORY_CARE_PROVIDER_SITE_OTHER): Payer: No Typology Code available for payment source | Admitting: Family Medicine

## 2016-04-29 ENCOUNTER — Encounter (INDEPENDENT_AMBULATORY_CARE_PROVIDER_SITE_OTHER): Payer: Self-pay | Admitting: Family Medicine

## 2016-04-29 DIAGNOSIS — R42 Dizziness and giddiness: Secondary | ICD-10-CM | POA: Insufficient documentation

## 2016-04-29 DIAGNOSIS — R002 Palpitations: Secondary | ICD-10-CM

## 2016-04-29 NOTE — Progress Notes (Signed)
Nursing Documentation:  Limb alert status: Patient asked and denied any limb restrictions for blood pressure/blood draws.  Has the patient seen any other providers since their last visit: no  The patient is due for influenza vaccine

## 2016-04-29 NOTE — Progress Notes (Signed)
Subjective:      Patient ID: Debra Norman is a 35 y.o. female     Chief Complaint   Patient presents with   . Establish Care     NP    . Dizziness     on going for the last month         HPI     . Heart palpitations      Was seen for heart palpitations on 04/21/16 in the ED, EKG was normal was given Ativan for anxiety , and was told that she has to follow up with Cardiology, does not want to see a cardiologist      . Dizziness 04/29/2016     started for 2 weeks off and on , room is spinning clock wise , feeling weak, feeling that she is going to loose of consciousness, lasting a minute, once day, no aggravating factors, mild headaches, associated with tinnitus both ears , no CP, no heart palpitations           Social; house wife , 4 children ,   The following portions of the patient's history were reviewed and updated as appropriate: allergies, current medications, past medical history, past social history and problem list.    Review of Systems   Respiratory: Negative for cough, choking, chest tightness, shortness of breath, wheezing and stridor.    Cardiovascular: Negative for chest pain, palpitations and leg swelling.   Psychiatric/Behavioral: Negative for confusion, decreased concentration, dysphoric mood, hallucinations, self-injury, sleep disturbance and suicidal ideas. The patient is not nervous/anxious.           BP 116/72 (BP Site: Left arm, Patient Position: Sitting, Cuff Size: Medium)   Pulse 80   Temp 98.5 F (36.9 C) (Tympanic)   Resp 14   Ht 1.626 m (5\' 4" )   Wt 56.7 kg (125 lb)   LMP 04/07/2016 (Within Days)   BMI 21.46 kg/m      Objective:     Physical Exam   HENT:   Head: Normocephalic and atraumatic.   Right Ear: External ear normal.   Left Ear: External ear normal.   Nose: Nose normal.   Mouth/Throat: Oropharynx is clear and moist.   Cardiovascular: Normal rate, regular rhythm and normal heart sounds.  Exam reveals no gallop and no friction rub.    No murmur heard.  Pulmonary/Chest: No  respiratory distress. She has no wheezes. She has no rales. She exhibits no tenderness.         Assessment:     1. Heart palpitations    2. Dizziness         Plan:     1. Heart palpitations    - Ambulatory referral to Cardiology    2. Dizziness     - Ambulatory referral to ENT  - MRI Brain W WO Contrast; Future  - Ambulatory referral to Cardiology    [facility], MD

## 2016-05-13 ENCOUNTER — Ambulatory Visit (INDEPENDENT_AMBULATORY_CARE_PROVIDER_SITE_OTHER): Payer: No Typology Code available for payment source | Admitting: Family Medicine

## 2016-05-27 ENCOUNTER — Ambulatory Visit: Payer: No Typology Code available for payment source | Attending: Family Medicine

## 2016-05-27 ENCOUNTER — Other Ambulatory Visit (INDEPENDENT_AMBULATORY_CARE_PROVIDER_SITE_OTHER): Payer: Self-pay | Admitting: Family Medicine

## 2016-05-27 DIAGNOSIS — R42 Dizziness and giddiness: Secondary | ICD-10-CM | POA: Insufficient documentation

## 2016-05-27 DIAGNOSIS — R202 Paresthesia of skin: Secondary | ICD-10-CM | POA: Insufficient documentation

## 2016-05-30 ENCOUNTER — Encounter (INDEPENDENT_AMBULATORY_CARE_PROVIDER_SITE_OTHER): Payer: Self-pay

## 2016-06-03 ENCOUNTER — Ambulatory Visit (INDEPENDENT_AMBULATORY_CARE_PROVIDER_SITE_OTHER): Payer: No Typology Code available for payment source | Admitting: Internal Medicine

## 2016-06-03 ENCOUNTER — Telehealth (INDEPENDENT_AMBULATORY_CARE_PROVIDER_SITE_OTHER): Payer: Self-pay | Admitting: Family Medicine

## 2016-06-03 NOTE — Telephone Encounter (Signed)
MRI of the brain was normal.

## 2016-06-03 NOTE — Telephone Encounter (Signed)
Patient is requesting a call back to discuss MRI Brain results. She can be reached at [phone].

## 2016-06-06 NOTE — Telephone Encounter (Signed)
Pt.notified

## 2016-06-10 ENCOUNTER — Ambulatory Visit (INDEPENDENT_AMBULATORY_CARE_PROVIDER_SITE_OTHER): Payer: No Typology Code available for payment source | Admitting: Family Medicine

## 2016-06-24 ENCOUNTER — Ambulatory Visit (INDEPENDENT_AMBULATORY_CARE_PROVIDER_SITE_OTHER): Payer: No Typology Code available for payment source | Admitting: Internal Medicine

## 2017-09-29 ENCOUNTER — Other Ambulatory Visit (INDEPENDENT_AMBULATORY_CARE_PROVIDER_SITE_OTHER): Payer: BLUE CROSS/BLUE SHIELD

## 2017-09-29 ENCOUNTER — Encounter: Payer: Self-pay | Admitting: Nurse Practitioner

## 2017-09-29 ENCOUNTER — Ambulatory Visit: Payer: BLUE CROSS/BLUE SHIELD | Admitting: Nurse Practitioner

## 2017-09-29 VITALS — BP 108/68 | HR 78 | Temp 98.1°F | Resp 16 | Ht 64.0 in | Wt 134.8 lb

## 2017-09-29 DIAGNOSIS — D649 Anemia, unspecified: Secondary | ICD-10-CM

## 2017-09-29 DIAGNOSIS — E876 Hypokalemia: Secondary | ICD-10-CM | POA: Diagnosis not present

## 2017-09-29 LAB — CBC WITH DIFFERENTIAL/PLATELET
Basophils Absolute: 0 10*3/uL (ref 0.0–0.1)
Basophils Relative: 0.9 % (ref 0.0–3.0)
EOS PCT: 2.5 % (ref 0.0–5.0)
Eosinophils Absolute: 0.1 10*3/uL (ref 0.0–0.7)
HCT: 40.8 % (ref 36.0–46.0)
HEMOGLOBIN: 13.6 g/dL (ref 12.0–15.0)
LYMPHS PCT: 35.2 % (ref 12.0–46.0)
Lymphs Abs: 1.5 10*3/uL (ref 0.7–4.0)
MCHC: 33.3 g/dL (ref 30.0–36.0)
MCV: 91.7 fl (ref 78.0–100.0)
MONOS PCT: 10.8 % (ref 3.0–12.0)
Monocytes Absolute: 0.5 10*3/uL (ref 0.1–1.0)
Neutro Abs: 2.2 10*3/uL (ref 1.4–7.7)
Neutrophils Relative %: 50.6 % (ref 43.0–77.0)
Platelets: 199 10*3/uL (ref 150.0–400.0)
RBC: 4.45 Mil/uL (ref 3.87–5.11)
RDW: 12.8 % (ref 11.5–15.5)
WBC: 4.3 10*3/uL (ref 4.0–10.5)

## 2017-09-29 LAB — COMPREHENSIVE METABOLIC PANEL
ALBUMIN: 4.4 g/dL (ref 3.5–5.2)
ALT: 8 U/L (ref 0–35)
AST: 11 U/L (ref 0–37)
Alkaline Phosphatase: 36 U/L — ABNORMAL LOW (ref 39–117)
BUN: 10 mg/dL (ref 6–23)
CALCIUM: 9.4 mg/dL (ref 8.4–10.5)
CHLORIDE: 104 meq/L (ref 96–112)
CO2: 27 mEq/L (ref 19–32)
Creatinine, Ser: 0.59 mg/dL (ref 0.40–1.20)
GFR: 149.92 mL/min (ref >60.00)
Glucose, Bld: 66 mg/dL — ABNORMAL LOW (ref 70–99)
POTASSIUM: 3.7 meq/L (ref 3.5–5.1)
SODIUM: 140 meq/L (ref 135–145)
Total Bilirubin: 0.7 mg/dL (ref 0.2–1.2)
Total Protein: 7.5 g/dL (ref 6.0–8.3)

## 2017-09-29 LAB — FERRITIN: FERRITIN: 9.7 ng/mL — AB (ref 10.0–291.0)

## 2017-09-29 LAB — IRON: Iron: 56 ug/dL (ref 42–145)

## 2017-09-29 NOTE — Assessment & Plan Note (Signed)
-   CBC with Differential/Platelet; Future - Ferritin; Future - Iron; Future

## 2017-09-29 NOTE — Patient Instructions (Signed)
Please head downstairs for lab work.  Id like to see you back in about 1 month for your annual physical.  Make sure you are eating a healthy, well balanced diet!  It was nice to meet you. Welcome to [facility]!   Mediterranean Diet A Mediterranean diet refers to food and lifestyle choices that are based on the traditions of countries located on the [employer]. This way of eating has been shown to help prevent certain conditions and improve outcomes for people who have chronic diseases, like kidney disease and heart disease. What are tips for following this plan? Lifestyle  Cook and eat meals together with your family, when possible.  Drink enough fluid to keep your urine clear or pale yellow.  Be physically active every day. This includes: ? Aerobic exercise like running or swimming. ? Leisure activities like gardening, walking, or housework.  Get 7-8 hours of sleep each night.  If recommended by your health care provider, drink red wine in moderation. This means 1 glass a day for nonpregnant women and 2 glasses a day for men. A glass of wine equals 5 oz (150 mL). Reading food labels  Check the serving size of packaged foods. For foods such as rice and pasta, the serving size refers to the amount of cooked product, not dry.  Check the total fat in packaged foods. Avoid foods that have saturated fat or trans fats.  Check the ingredients list for added sugars, such as corn syrup. Shopping  At the grocery store, buy most of your food from the areas near the walls of the store. This includes: ? Fresh fruits and vegetables (produce). ? Grains, beans, nuts, and seeds. Some of these may be available in unpackaged forms or large amounts (in bulk). ? Fresh seafood. ? Poultry and eggs. ? Low-fat dairy products.  Buy whole ingredients instead of prepackaged foods.  Buy fresh fruits and vegetables in-season from local farmers markets.  Buy frozen fruits and vegetables in  resealable bags.  If you do not have access to quality fresh seafood, buy precooked frozen shrimp or canned fish, such as tuna, salmon, or sardines.  Buy small amounts of raw or cooked vegetables, salads, or olives from the deli or salad bar at your store.  Stock your pantry so you always have certain foods on hand, such as olive oil, canned tuna, canned tomatoes, rice, pasta, and beans. Cooking  Cook foods with extra-virgin olive oil instead of using butter or other vegetable oils.  Have meat as a side dish, and have vegetables or grains as your main dish. This means having meat in small portions or adding small amounts of meat to foods like pasta or stew.  Use beans or vegetables instead of meat in common dishes like chili or lasagna.  Experiment with different cooking methods. Try roasting or broiling vegetables instead of steaming or sauteing them.  Add frozen vegetables to soups, stews, pasta, or rice.  Add nuts or seeds for added healthy fat at each meal. You can add these to yogurt, salads, or vegetable dishes.  Marinate fish or vegetables using olive oil, lemon juice, garlic, and fresh herbs. Meal planning  Plan to eat 1 vegetarian meal one day each week. Try to work up to 2 vegetarian meals, if possible.  Eat seafood 2 or more times a week.  Have healthy snacks readily available, such as: ? Vegetable sticks with hummus. ? [place] yogurt. ? Fruit and nut trail mix.  Eat balanced meals throughout the  week. This includes: ? Fruit: 2-3 servings a day ? Vegetables: 4-5 servings a day ? Low-fat dairy: 2 servings a day ? Fish, poultry, or lean meat: 1 serving a day ? Beans and legumes: 2 or more servings a week ? Nuts and seeds: 1-2 servings a day ? Whole grains: 6-8 servings a day ? Extra-virgin olive oil: 3-4 servings a day  Limit red meat and sweets to only a few servings a month What are my food choices?  Mediterranean diet ? Recommended ? Grains: Whole-grain  pasta. Brown rice. Bulgar wheat. Polenta. Couscous. Whole-wheat bread. [place]. ? Vegetables: Artichokes. Beets. Broccoli. Cabbage. Carrots. Eggplant. Green beans. Chard. Kale. Spinach. Onions. Leeks. Peas. Squash. Tomatoes. Peppers. Radishes. ? Fruits: Apples. Apricots. Avocado. Berries. Bananas. Cherries. Dates. Figs. Grapes. Lemons. Melon. Oranges. Peaches. Plums. Pomegranate. ? Meats and other protein foods: Beans. Almonds. Sunflower seeds. Pine nuts. Peanuts. [place]. Salmon. Scallops. Shrimp. [place]. Tilapia. Clams. Oysters. Eggs. ? Dairy: Low-fat milk. Cheese. Greek yogurt. ? Beverages: Water. Red wine. Herbal tea. ? Fats and oils: Extra virgin olive oil. Avocado oil. Grape seed oil. ? Sweets and desserts: [place] yogurt with honey. Baked apples. Poached pears. Trail mix. ? Seasoning and other foods: Basil. Cilantro. Coriander. Cumin. Mint. Parsley. Sage. Rosemary. Tarragon. Garlic. Oregano. Thyme. Pepper. Balsalmic vinegar. Tahini. Hummus. Tomato sauce. Olives. Mushrooms. ? Limit these ? Grains: Prepackaged pasta or rice dishes. Prepackaged cereal with added sugar. ? Vegetables: Deep fried potatoes (french fries). ? Fruits: Fruit canned in syrup. ? Meats and other protein foods: Beef. Pork. Lamb. Poultry with skin. Hot dogs. Bernie[place] Salines. ? Dairy: Ice cream. Sour cream. Whole milk. ? Beverages: Juice. Sugar-sweetened soft drinks. Beer. Liquor and spirits. ? Fats and oils: Butter. Canola oil. Vegetable oil. Beef fat (tallow). Lard. ? Sweets and desserts: Cookies. Cakes. Pies. Candy. ? Seasoning and other foods: Mayonnaise. Premade sauces and marinades. ? The items listed may not be a complete list. Talk with your dietitian about what dietary choices are right for you. Summary  The Mediterranean diet includes both food and lifestyle choices.  Eat a variety of fresh fruits and vegetables, beans, nuts, seeds, and whole grains.  Limit the amount of red meat and sweets that you eat.  Talk with  your health care provider about whether it is safe for you to drink red wine in moderation. This means 1 glass a day for nonpregnant women and 2 glasses a day for men. A glass of wine equals 5 oz (150 mL). This information is not intended to replace advice given to you by your health care provider. Make sure you discuss any questions you have with your health care provider. Document Released: 03/28/2016 Document Revised: 04/30/2016 Document Reviewed: 03/28/2016 Elsevier Interactive Patient Education  [employer].

## 2017-09-29 NOTE — Progress Notes (Signed)
Name: Cassandra Vargas   MRN: [id number]    DOB: 02-Mar-1981   Date:09/29/2017       Progress Note  Subjective  Chief Complaint  Chief Complaint  Patient presents with  . Establish Care    has history of low ferritin level and potassium level dropping, feels like she can not take a deep breath at times    HPI Cassandra Vargas is a 37 yo female establishing care today. Her prior PCP was in [place], she moved to [place] last year and has not established with a new PCP yet. She is requesting to have her ferritin and potassium checked today.  Anemia-  This is not a new problem-has been occurring for several years. She says her ferritin "runs low." She was prescribed iron pills by prior provider but stopped taking them on her own She has not had any recent lab work. She has noticed a feeling like she cant take a full breath over the past few months- she has felt this in the past when her ferritin was low. She says this does not feel like shortness of breath. She says she overall feels okay She denies fevers, weakness, fatigue, chest pain, coughing, abdominal pain, nausea, vomiting, rectal bleeding, heavy vaginal bleeding, rash, pallor.  Hypokalemia-  She has had low potassium once in the past-She was prescribed short term potassium supplement but is not currently maintained on potassium. She Reports normal appetite- eats three meals a day Denies syncope, palpitations,diarrhea, edema.  Patient Active Problem List   Diagnosis Date Noted  . Indication for care in labor or delivery 07/09/2015  . Encounter for supervision of normal pregnancy in multigravida 07/09/2015    Past Surgical History:  Procedure Laterality Date  . NO PAST SURGERIES    . TUBAL LIGATION Bilateral 07/09/2015   Procedure: POST PARTUM TUBAL LIGATION;  Surgeon: Olga Millers, MD;  Location: [facility];  Service: Gynecology;  Laterality: Bilateral;    Family History  Problem Relation Age of Onset  . Alcohol abuse Neg Hx      Social History   Socioeconomic History  . Marital status: Married    Spouse name: Not on file  . Number of children: Not on file  . Years of education: Not on file  . Highest education level: Not on file  Social Needs  . Financial resource strain: Not on file  . Food insecurity - worry: Not on file  . Food insecurity - inability: Not on file  . Transportation needs - medical: Not on file  . Transportation needs - non-medical: Not on file  Occupational History  . Not on file  Tobacco Use  . Smoking status: Never Smoker  . Smokeless tobacco: Never Used  Substance and Sexual Activity  . Alcohol use: No  . Drug use: No  . Sexual activity: Not on file  Other Topics Concern  . Not on file  Social History Narrative  . Not on file    No current outpatient medications on file.  No Known Allergies   Review of Systems  See HPI  Objective  Vitals:   09/29/17 1257  BP: 108/68  Pulse: 78  Resp: 16  Temp: 98.1 F (36.7 C)  TempSrc: Oral  SpO2: 98%  Weight: 134 lb 12.8 oz (61.1 kg)  Height: 5\' 4"  (1.626 m)     Body mass index is 23.14 kg/m.  Physical Exam Vital signs reviewed. Constitutional: Patient appears well-developed and well-nourished. No distress.  HENT: Head: Normocephalic and  atraumatic. Nose: Nose normal. Mouth/Throat: Oropharynx is clear and moist. No oropharyngeal exudate.  Eyes: Conjunctivae are normal. No scleral icterus.  Neck: Normal range of motion. Neck supple. Cardiovascular: Normal rate, regular rhythm and normal heart sounds.  No murmur heard. No BLE edema. Distal pulses intact. Pulmonary/Chest: Effort normal and breath sounds normal. No respiratory distress. Abdominal: Soft. Bowel sounds are normal, no distension. There is no tenderness, no masses.  Musculoskeletal: Normal range of motion, no joint effusions. No gross deformities Neurological: She is alert and oriented to person, place, and time. Coordination, balance, strength, speech and  gait are normal.  Skin: Skin is warm and dry. No rash noted. No erythema.  Psychiatric: Patient has a normal mood and affect. behavior is normal. Judgment and thought content normal.   Assessment & Plan RTC in 1 month for CPE We discussed importance of eating a healthy balanced diet See AVS for education provided to patient   Hypokalemia - Comprehensive metabolic panel; Future

## 2017-10-01 ENCOUNTER — Telehealth: Payer: Self-pay | Admitting: Nurse Practitioner

## 2017-10-01 NOTE — Telephone Encounter (Signed)
Copied from [place] [id number]. Topic: Quick Communication - Lab Results >> Oct 01, 2017  8:57 AM Burnis Medin, NT wrote: Pt  is calling to see if her labs results were in and would like a call back.

## 2017-10-02 NOTE — Telephone Encounter (Signed)
Pt aware of results interpreted by Dr. Quay Burow.

## 2017-10-07 ENCOUNTER — Ambulatory Visit: Payer: Self-pay | Admitting: *Deleted

## 2017-10-07 NOTE — Telephone Encounter (Signed)
Pt called to confirm that 325 mg iron is the same as 65 mg of iron; explained to pt that 325 mg of ferrous sulfate equals elemental iron 65 mg; pt verbalizes understanding.   Reason for Disposition . Caller has medication question only, adult not sick, and triager answers question  Answer Assessment - Initial Assessment Questions 1. REASON FOR CALL or QUESTION: "What is your reason for calling today?" or "How can I best help you?" or "What question do you have that I can help answer?"     Is 325 mg iron the same as 65 mg of iron  Protocols used: MEDICATION QUESTION CALL-A-AH, INFORMATION ONLY CALL-A-AH

## 2017-11-03 ENCOUNTER — Ambulatory Visit: Payer: BLUE CROSS/BLUE SHIELD | Admitting: Nurse Practitioner

## 2017-11-03 DIAGNOSIS — Z0289 Encounter for other administrative examinations: Secondary | ICD-10-CM

## 2017-12-11 ENCOUNTER — Ambulatory Visit: Payer: BLUE CROSS/BLUE SHIELD | Admitting: Nurse Practitioner

## 2017-12-11 ENCOUNTER — Other Ambulatory Visit (INDEPENDENT_AMBULATORY_CARE_PROVIDER_SITE_OTHER): Payer: BLUE CROSS/BLUE SHIELD

## 2017-12-11 ENCOUNTER — Encounter: Payer: Self-pay | Admitting: Nurse Practitioner

## 2017-12-11 ENCOUNTER — Encounter

## 2017-12-11 VITALS — BP 108/78 | HR 79 | Temp 98.4°F | Resp 16 | Ht 64.0 in | Wt 124.0 lb

## 2017-12-11 DIAGNOSIS — D500 Iron deficiency anemia secondary to blood loss (chronic): Secondary | ICD-10-CM

## 2017-12-11 LAB — CBC WITH DIFFERENTIAL/PLATELET
BASOS ABS: 0 10*3/uL (ref 0.0–0.1)
BASOS PCT: 0.6 % (ref 0.0–3.0)
Eosinophils Absolute: 0.1 10*3/uL (ref 0.0–0.7)
Eosinophils Relative: 2.4 % (ref 0.0–5.0)
HCT: 43.3 % (ref 36.0–46.0)
Hemoglobin: 14.7 g/dL (ref 12.0–15.0)
LYMPHS ABS: 1.4 10*3/uL (ref 0.7–4.0)
Lymphocytes Relative: 30.4 % (ref 12.0–46.0)
MCHC: 34 g/dL (ref 30.0–36.0)
MCV: 90.3 fl (ref 78.0–100.0)
MONOS PCT: 10.6 % (ref 3.0–12.0)
Monocytes Absolute: 0.5 10*3/uL (ref 0.1–1.0)
NEUTROS ABS: 2.6 10*3/uL (ref 1.4–7.7)
NEUTROS PCT: 56 % (ref 43.0–77.0)
PLATELETS: 229 10*3/uL (ref 150.0–400.0)
RBC: 4.8 Mil/uL (ref 3.87–5.11)
RDW: 12.7 % (ref 11.5–15.5)
WBC: 4.6 10*3/uL (ref 4.0–10.5)

## 2017-12-11 LAB — FERRITIN: Ferritin: 9.9 ng/mL — ABNORMAL LOW (ref 10.0–291.0)

## 2017-12-11 LAB — IRON: Iron: 71 ug/dL (ref 42–145)

## 2017-12-11 NOTE — Assessment & Plan Note (Signed)
Appears stable today Will Update labs and F/U with further recommendations pending lab results Home management of iron deficiency anemia including return precautions discussed and information printed on AVS - CBC with Differential/Platelet; Future - Ferritin; Future - Iron; Future

## 2017-12-11 NOTE — Patient Instructions (Signed)
Please head downstairs for lab work/x-rays. If any of your test results are critically abnormal, you will be contacted right away. Your results may be released to your MyChart for viewing before I am able to provide you with my response. I will contact you within a week about your test results and any recommendations for abnormalities.  Please return in about 1 month for you annual physical.  It was good to see you. Thanks for letting me take care of you today :)   Iron Deficiency Anemia, Adult Iron-deficiency anemia is when you have a low amount of red blood cells or hemoglobin. This happens because you have too little iron in your body. Hemoglobin carries oxygen to parts of the body. Anemia can cause your body to not get enough oxygen. It may or may not cause symptoms. Follow these instructions at home: Medicines  Take over-the-counter and prescription medicines only as told by your doctor. This includes iron pills (supplements) and vitamins.  If you cannot handle taking iron pills by mouth, ask your doctor about getting iron through: ? A vein (intravenously). ? A shot (injection) into a muscle.  Take iron pills when your stomach is empty. If you cannot handle this, take them with food.  Do not drink milk or take antacids at the same time as your iron pills.  To prevent trouble pooping (constipation), eat fiber or take medicine (stool softener) as told by your doctor. Eating and drinking  Talk with your doctor before changing the foods you eat. He or she may tell you to eat foods that have a lot of iron, such as: ? Liver. ? Lowfat (lean) beef. ? Breads and cereals that have iron added to them (fortified breads and cereals). ? Eggs. ? Dried fruit. ? Dark green, leafy vegetables.  Drink enough fluid to keep your pee (urine) clear or pale yellow.  Eat fresh fruits and vegetables that are high in vitamin C. They help your body to use iron. Foods with a lot of vitamin C  include: ? Oranges. ? Peppers. ? Tomatoes. ? Mangoes. General instructions  Return to your normal activities as told by your doctor. Ask your doctor what activities are safe for you.  Keep yourself clean, and keep things clean around you (your surroundings). Anemia can make you get sick more easily.  Keep all follow-up visits as told by your doctor. This is important. Contact a doctor if:  You feel sick to your stomach (nauseous).  You throw up (vomit).  You feel weak.  You are sweating for no clear reason.  You have trouble pooping, such as: ? Pooping (having a bowel movement) less than 3 times a week. ? Straining to poop. ? Having poop that is hard, dry, or larger than normal. ? Feeling full or bloated. ? Pain in the lower belly. ? Not feeling better after pooping. Get help right away if:  You pass out (faint). If this happens, do not drive yourself to the hospital. Call your local emergency services (911 in the [place]  You have chest pain.  You have shortness of breath that: ? Is very bad. ? Gets worse with physical activity.  You have a fast heartbeat.  You get light-headed when getting up from sitting or lying down. This information is not intended to replace advice given to you by your health care provider. Make sure you discuss any questions you have with your health care provider. Document Released: 09/07/2010 Document Revised: 04/24/2016 Document Reviewed: 04/24/2016 Elsevier  Interactive Patient Education  2017 [employer].

## 2017-12-11 NOTE — Progress Notes (Signed)
Name: DEICY RUSK   MRN: [id number]    DOB: [birth year]   Date:12/11/2017       Progress Note  Subjective  Chief Complaint  Chief Complaint  Patient presents with  . Follow-up    anemia has been taking iron supplement    HPI  NOTE FROM 09/29/17 visit: Anemia-  This is not a new problem-has been occurring for several years. She says her ferritin "runs low." She was prescribed iron pills by prior provider but stopped taking them on her own She has not had any recent lab work. She has noticed a feeling like she cant take a full breath over the past few months- she has felt this in the past when her ferritin was low. She says this does not feel like shortness of breath. She says she overall feels okay She denies fevers, weakness, fatigue, chest pain, coughing, abdominal pain, nausea, vomiting, rectal bleeding, heavy vaginal bleeding, rash, pallor.  Labs from 09/29/17 showed: normal CBC, slightly low ferritin, low normal iron. She was instructed to start over the counter ferrous sulfate 325 three times a week for her low ferritin level and return for follow up in 3 months.  TODAY: She says she has started the ferritin supplement, does admit that she sometimes forgets doses but does try to take it three times a week on most weeks. She feels like she tolerates the ferritin well without any noted adverse reactions She says she feels better and has not had the feeling that she cant catch her breath anymore  She denies fevers, weakness, fatigue, chest pain, coughing, abdominal pain, nausea, vomiting, rectal bleeding, constipation, heavy vaginal bleeding, rash, pallor.  Patient Active Problem List   Diagnosis Date Noted  . Anemia 09/29/2017  . Indication for care in labor or delivery 07/09/2015  . Encounter for supervision of normal pregnancy in multigravida 07/09/2015    Past Surgical History:  Procedure Laterality Date  . NO PAST SURGERIES    . TUBAL LIGATION Bilateral 07/09/2015   Procedure: POST PARTUM TUBAL LIGATION;  Surgeon: Olga Millers, MD;  Location: [facility];  Service: Gynecology;  Laterality: Bilateral;    Family History  Problem Relation Age of Onset  . Alcohol abuse Neg Hx     Social History   Socioeconomic History  . Marital status: Married    Spouse name: Not on file  . Number of children: Not on file  . Years of education: Not on file  . Highest education level: Not on file  Occupational History  . Not on file  Social Needs  . Financial resource strain: Not on file  . Food insecurity:    Worry: Not on file    Inability: Not on file  . Transportation needs:    Medical: Not on file    Non-medical: Not on file  Tobacco Use  . Smoking status: Never Smoker  . Smokeless tobacco: Never Used  Substance and Sexual Activity  . Alcohol use: No  . Drug use: No  . Sexual activity: Not on file  Lifestyle  . Physical activity:    Days per week: Not on file    Minutes per session: Not on file  . Stress: Not on file  Relationships  . Social connections:    Talks on phone: Not on file    Gets together: Not on file    Attends religious service: Not on file    Active member of club or organization: Not on file  Attends meetings of clubs or organizations: Not on file    Relationship status: Not on file  . Intimate partner violence:    Fear of current or ex partner: Not on file    Emotionally abused: Not on file    Physically abused: Not on file    Forced sexual activity: Not on file  Other Topics Concern  . Not on file  Social History Narrative  . Not on file    No current outpatient medications on file.  No Known Allergies   ROS See HPI  Objective  Vitals:   12/11/17 0942  BP: 108/78  Pulse: 79  Resp: 16  Temp: 98.4 F (36.9 C)  TempSrc: Oral  SpO2: 97%  Weight: 124 lb (56.2 kg)  Height: 5\' 4"  (1.626 m)    Body mass index is 21.28 kg/m.  Physical Exam Vital signs reviewed. Constitutional: Patient appears  well-developed and well-nourished. No distress.  HENT: Head: Normocephalic and atraumatic. Nose: Nose normal. Mouth/Throat: Oropharynx is clear and moist. No oropharyngeal exudate.  Eyes: Conjunctivae and EOM are normal. Pupils are equal, round, and reactive to light. No scleral icterus.  Neck: Normal range of motion. Neck supple.  Cardiovascular: Normal rate, regular rhythm and normal heart sounds.  No murmur heard. Distal pulses intact. Pulmonary/Chest: Effort normal and breath sounds normal. No respiratory distress. Abdominal: Soft. No distension Neurological: She is alert and oriented to person, place, and time. Coordination, balance, strength, speech and gait are normal.  Skin: Skin is warm and dry. No rash noted. No erythema.  Psychiatric: Patient has a normal mood and affect. behavior is normal. Judgment and thought content normal.  Assessment & Plan RTC in 1 month for CPE

## 2018-01-08 ENCOUNTER — Encounter: Payer: Self-pay | Admitting: Nurse Practitioner

## 2018-01-08 ENCOUNTER — Ambulatory Visit (INDEPENDENT_AMBULATORY_CARE_PROVIDER_SITE_OTHER): Payer: BLUE CROSS/BLUE SHIELD | Admitting: Nurse Practitioner

## 2018-01-08 ENCOUNTER — Other Ambulatory Visit (INDEPENDENT_AMBULATORY_CARE_PROVIDER_SITE_OTHER): Payer: BLUE CROSS/BLUE SHIELD

## 2018-01-08 VITALS — BP 118/72 | HR 69 | Temp 98.8°F | Resp 16 | Ht 64.0 in | Wt 122.2 lb

## 2018-01-08 DIAGNOSIS — Z0000 Encounter for general adult medical examination without abnormal findings: Secondary | ICD-10-CM

## 2018-01-08 DIAGNOSIS — Z13220 Encounter for screening for lipoid disorders: Secondary | ICD-10-CM

## 2018-01-08 DIAGNOSIS — R7309 Other abnormal glucose: Secondary | ICD-10-CM

## 2018-01-08 DIAGNOSIS — Z23 Encounter for immunization: Secondary | ICD-10-CM | POA: Diagnosis not present

## 2018-01-08 DIAGNOSIS — Z1159 Encounter for screening for other viral diseases: Secondary | ICD-10-CM | POA: Insufficient documentation

## 2018-01-08 LAB — LIPID PANEL
CHOLESTEROL: 120 mg/dL (ref 0–200)
HDL: 52.7 mg/dL (ref >39.00)
LDL CALC: 55 mg/dL (ref 0–99)
NonHDL: 67.48
TRIGLYCERIDES: 61 mg/dL (ref 0.0–149.0)
Total CHOL/HDL Ratio: 2
VLDL: 12.2 mg/dL (ref 0.0–40.0)

## 2018-01-08 LAB — COMPREHENSIVE METABOLIC PANEL
ALBUMIN: 4.7 g/dL (ref 3.5–5.2)
ALT: 8 U/L (ref 0–35)
AST: 10 U/L (ref 0–37)
Alkaline Phosphatase: 40 U/L (ref 39–117)
BUN: 10 mg/dL (ref 6–23)
CALCIUM: 10.1 mg/dL (ref 8.4–10.5)
CHLORIDE: 104 meq/L (ref 96–112)
CO2: 27 mEq/L (ref 19–32)
CREATININE: 0.7 mg/dL (ref 0.40–1.20)
GFR: 122.87 mL/min (ref >60.00)
Glucose, Bld: 101 mg/dL — ABNORMAL HIGH (ref 70–99)
POTASSIUM: 4 meq/L (ref 3.5–5.1)
Sodium: 140 mEq/L (ref 135–145)
Total Bilirubin: 1.5 mg/dL — ABNORMAL HIGH (ref 0.2–1.2)
Total Protein: 8.1 g/dL (ref 6.0–8.3)

## 2018-01-08 LAB — TSH: TSH: 1.47 u[IU]/mL (ref 0.35–4.50)

## 2018-01-08 LAB — HEMOGLOBIN A1C: Hgb A1c MFr Bld: 5.6 % (ref 4.6–6.5)

## 2018-01-08 NOTE — Patient Instructions (Addendum)
Please head downstairs for lab work/x-rays. If any of your test results are critically abnormal, you will be contacted right away. Your results may be released to your MyChart for viewing before I am able to provide you with my response. I will contact you within a week about your test results and any recommendations for abnormalities.  Please work on healthy diet and regular exercise. Remember half of your plate should be veggies, one-fourth carbs, one-fourth meat, and don't eat meat at every meal. Also, remember to stay away from sugary drinks. I'd like for you to start incorporating exercise into your daily schedule. Start at 10 minutes a day, working up to 30 minutes five times a week.    Please return in about 6 months for anemia follow up.   Health Maintenance, Female Adopting a healthy lifestyle and getting preventive care can go a long way to promote health and wellness. Talk with your health care provider about what schedule of regular examinations is right for you. This is a good chance for you to check in with your provider about disease prevention and staying healthy. In between checkups, there are plenty of things you can do on your own. Experts have done a lot of research about which lifestyle changes and preventive measures are most likely to keep you healthy. Ask your health care provider for more information. Weight and diet Eat a healthy diet  Be sure to include plenty of vegetables, fruits, low-fat dairy products, and lean protein.  Do not eat a lot of foods high in solid fats, added sugars, or salt.  Get regular exercise. This is one of the most important things you can do for your health. ? Most adults should exercise for at least 150 minutes each week. The exercise should increase your heart rate and make you sweat (moderate-intensity exercise). ? Most adults should also do strengthening exercises at least twice a week. This is in addition to the moderate-intensity  exercise.  Maintain a healthy weight  Body mass index (BMI) is a measurement that can be used to identify possible weight problems. It estimates body fat based on height and weight. Your health care provider can help determine your BMI and help you achieve or maintain a healthy weight.  For females 35 years of age and older: ? A BMI below 18.5 is considered underweight. ? A BMI of 18.5 to 24.9 is normal. ? A BMI of 25 to 29.9 is considered overweight. ? A BMI of 30 and above is considered obese.  Watch levels of cholesterol and blood lipids  You should start having your blood tested for lipids and cholesterol at 35 years of age, then have this test every 5 years.  You may need to have your cholesterol levels checked more often if: ? Your lipid or cholesterol levels are high. ? You are older than 35 years of age. ? You are at high risk for heart disease.  Cancer screening Lung Cancer  Lung cancer screening is recommended for adults 17-45 years old who are at high risk for lung cancer because of a history of smoking.  A yearly low-dose CT scan of the lungs is recommended for people who: ? Currently smoke. ? Have quit within the past 15 years. ? Have at least a 30-pack-year history of smoking. A pack year is smoking an average of one pack of cigarettes a day for 1 year.  Yearly screening should continue until it has been 15 years since you quit.  Yearly  screening should stop if you develop a health problem that would prevent you from having lung cancer treatment.  Breast Cancer  Practice breast self-awareness. This means understanding how your breasts normally appear and feel.  It also means doing regular breast self-exams. Let your health care provider know about any changes, no matter how small.  If you are in your 20s or 30s, you should have a clinical breast exam (CBE) by a health care provider every 1-3 years as part of a regular health exam.  If you are 81 or older, have  a CBE every year. Also consider having a breast X-ray (mammogram) every year.  If you have a family history of breast cancer, talk to your health care provider about genetic screening.  If you are at high risk for breast cancer, talk to your health care provider about having an MRI and a mammogram every year.  Breast cancer gene (BRCA) assessment is recommended for women who have family members with BRCA-related cancers. BRCA-related cancers include: ? Breast. ? Ovarian. ? Tubal. ? Peritoneal cancers.  Results of the assessment will determine the need for genetic counseling and BRCA1 and BRCA2 testing.  Cervical Cancer Your health care provider may recommend that you be screened regularly for cancer of the pelvic organs (ovaries, uterus, and vagina). This screening involves a pelvic examination, including checking for microscopic changes to the surface of your cervix (Pap test). You may be encouraged to have this screening done every 3 years, beginning at age 54.  For women ages 62-65, health care providers may recommend pelvic exams and Pap testing every 3 years, or they may recommend the Pap and pelvic exam, combined with testing for human papilloma virus (HPV), every 5 years. Some types of HPV increase your risk of cervical cancer. Testing for HPV may also be done on women of any age with unclear Pap test results.  Other health care providers may not recommend any screening for nonpregnant women who are considered low risk for pelvic cancer and who do not have symptoms. Ask your health care provider if a screening pelvic exam is right for you.  If you have had past treatment for cervical cancer or a condition that could lead to cancer, you need Pap tests and screening for cancer for at least 20 years after your treatment. If Pap tests have been discontinued, your risk factors (such as having a new sexual partner) need to be reassessed to determine if screening should resume. Some women have  medical problems that increase the chance of getting cervical cancer. In these cases, your health care provider may recommend more frequent screening and Pap tests.  Colorectal Cancer  This type of cancer can be detected and often prevented.  Routine colorectal cancer screening usually begins at 35 years of age and continues through 35 years of age.  Your health care provider may recommend screening at an earlier age if you have risk factors for colon cancer.  Your health care provider may also recommend using home test kits to check for hidden blood in the stool.  A small camera at the end of a tube can be used to examine your colon directly (sigmoidoscopy or colonoscopy). This is done to check for the earliest forms of colorectal cancer.  Routine screening usually begins at age 55.  Direct examination of the colon should be repeated every 5-10 years through 35 years of age. However, you may need to be screened more often if early forms of precancerous polyps  or small growths are found.  Skin Cancer  Check your skin from head to toe regularly.  Tell your health care provider about any new moles or changes in moles, especially if there is a change in a mole's shape or color.  Also tell your health care provider if you have a mole that is larger than the size of a pencil eraser.  Always use sunscreen. Apply sunscreen liberally and repeatedly throughout the day.  Protect yourself by wearing long sleeves, pants, a wide-brimmed hat, and sunglasses whenever you are outside.  Heart disease, diabetes, and high blood pressure  High blood pressure causes heart disease and increases the risk of stroke. High blood pressure is more likely to develop in: ? People who have blood pressure in the high end of the normal range (130-139/85-89 mm Hg). ? People who are overweight or obese. ? People who are African American.  If you are 77-27 years of age, have your blood pressure checked every 3-5  years. If you are 81 years of age or older, have your blood pressure checked every year. You should have your blood pressure measured twice-once when you are at a hospital or clinic, and once when you are not at a hospital or clinic. Record the average of the two measurements. To check your blood pressure when you are not at a hospital or clinic, you can use: ? An automated blood pressure machine at a pharmacy. ? A home blood pressure monitor.  If you are between 93 years and 92 years old, ask your health care provider if you should take aspirin to prevent strokes.  Have regular diabetes screenings. This involves taking a blood sample to check your fasting blood sugar level. ? If you are at a normal weight and have a low risk for diabetes, have this test once every three years after 35 years of age. ? If you are overweight and have a high risk for diabetes, consider being tested at a younger age or more often. Preventing infection Hepatitis B  If you have a higher risk for hepatitis B, you should be screened for this virus. You are considered at high risk for hepatitis B if: ? You were born in a country where hepatitis B is common. Ask your health care provider which countries are considered high risk. ? Your parents were born in a high-risk country, and you have not been immunized against hepatitis B (hepatitis B vaccine). ? You have HIV or AIDS. ? You use needles to inject street drugs. ? You live with someone who has hepatitis B. ? You have had sex with someone who has hepatitis B. ? You get hemodialysis treatment. ? You take certain medicines for conditions, including cancer, organ transplantation, and autoimmune conditions.  Hepatitis C  Blood testing is recommended for: ? Everyone born from [birth year] through 1963. ? Anyone with known risk factors for hepatitis C.  Sexually transmitted infections (STIs)  You should be screened for sexually transmitted infections (STIs) including  gonorrhea and chlamydia if: ? You are sexually active and are younger than 35 years of age. ? You are older than 35 years of age and your health care provider tells you that you are at risk for this type of infection. ? Your sexual activity has changed since you were last screened and you are at an increased risk for chlamydia or gonorrhea. Ask your health care provider if you are at risk.  If you do not have HIV, but are at risk,  it may be recommended that you take a prescription medicine daily to prevent HIV infection. This is called pre-exposure prophylaxis (PrEP). You are considered at risk if: ? You are sexually active and do not regularly use condoms or know the HIV status of your partner(s). ? You take drugs by injection. ? You are sexually active with a partner who has HIV.  Talk with your health care provider about whether you are at high risk of being infected with HIV. If you choose to begin PrEP, you should first be tested for HIV. You should then be tested every 3 months for as long as you are taking PrEP. Pregnancy  If you are premenopausal and you may become pregnant, ask your health care provider about preconception counseling.  If you may become pregnant, take 400 to 800 micrograms (mcg) of folic acid every day.  If you want to prevent pregnancy, talk to your health care provider about birth control (contraception). Osteoporosis and menopause  Osteoporosis is a disease in which the bones lose minerals and strength with aging. This can result in serious bone fractures. Your risk for osteoporosis can be identified using a bone density scan.  If you are 12 years of age or older, or if you are at risk for osteoporosis and fractures, ask your health care provider if you should be screened.  Ask your health care provider whether you should take a calcium or vitamin D supplement to lower your risk for osteoporosis.  Menopause may have certain physical symptoms and  risks.  Hormone replacement therapy may reduce some of these symptoms and risks. Talk to your health care provider about whether hormone replacement therapy is right for you. Follow these instructions at home:  Schedule regular health, dental, and eye exams.  Stay current with your immunizations.  Do not use any tobacco products including cigarettes, chewing tobacco, or electronic cigarettes.  If you are pregnant, do not drink alcohol.  If you are breastfeeding, limit how much and how often you drink alcohol.  Limit alcohol intake to no more than 1 drink per day for nonpregnant women. One drink equals 12 ounces of beer, 5 ounces of wine, or 1 ounces of hard liquor.  Do not use street drugs.  Do not share needles.  Ask your health care provider for help if you need support or information about quitting drugs.  Tell your health care provider if you often feel depressed.  Tell your health care provider if you have ever been abused or do not feel safe at home. This information is not intended to replace advice given to you by your health care provider. Make sure you discuss any questions you have with your health care provider. Document Released: 02/18/2011 Document Revised: 01/11/2016 Document Reviewed: 05/09/2015 Elsevier Interactive Patient Education  [employer].

## 2018-01-08 NOTE — Progress Notes (Signed)
Name: Cassandra Vargas   MRN: [id number]    DOB: [birth year]   Date:01/08/2018       Progress Note  Subjective  Chief Complaint  Chief Complaint  Patient presents with  . CPE    fasting    HPI  Patient presents for annual CPE.  Diet: 2-3 meals a day, snacks in between-chips, cakes, cookies, nutty bars, fruits; drinks water, gatorade Exercise: no routine exercise  USPSTF grade A and B recommendations  Depression:  Depression screen [facility] 2/9 01/08/2018  Decreased Interest 0  Down, Depressed, Hopeless 0  PHQ - 2 Score 0  Altered sleeping 0  Tired, decreased energy 0  Change in appetite 0  Feeling bad or failure about yourself  0  Trouble concentrating 0  Moving slowly or fidgety/restless 0  Suicidal thoughts 0  PHQ-9 Score 0  Difficult doing work/chores Not difficult at all   Hypertension: BP Readings from Last 3 Encounters:  01/08/18 118/72  12/11/17 108/78  09/29/17 108/68   Obesity: Wt Readings from Last 3 Encounters:  01/08/18 122 lb 3.2 oz (55.4 kg)  12/11/17 124 lb (56.2 kg)  09/29/17 134 lb 12.8 oz (61.1 kg)   BMI Readings from Last 3 Encounters:  01/08/18 20.98 kg/m  12/11/17 21.28 kg/m  09/29/17 23.14 kg/m    Alcohol: no  Tobacco use: no, never  HIV: screening done STD testing and prevention (chl/gon/syphilis): declines testing, no concerns  Intimate partner violence: denies, feels safe  Vaccinations: TDAP today  Advanced Care Planning: A voluntary discussion about advance care planning including the explanation and discussion of advance directives.  Discussed health care proxy and Living will, and the patient DOES NOT have a living will at present time. If patient does have living will, I have requested they bring this to the clinic to be scanned in to their chart.  Cervical cancer screening: PAP up to date, follows with GYN provider for routine womens health care   Lipids: lipid panel today No results found for: CHOL No results found for:  HDL No results found for: LDLCALC No results found for: TRIG No results found for: CHOLHDL No results found for: LDLDIRECT  Glucose: A1c today Glucose, Bld  Date Value Ref Range Status  09/29/2017 66 (L) 70 - 99 mg/dL Final  02/28/2016 90 65 - 99 mg/dL Final  10/11/2015 101 (H) 65 - 99 mg/dL Final    Skin cancer: no concerning lesions or moles, does not wear sunscreen  Aspirin: not indicated ECG: not indicated   Patient Active Problem List   Diagnosis Date Noted  . Anemia 09/29/2017  . Indication for care in labor or delivery 07/09/2015  . Encounter for supervision of normal pregnancy in multigravida 07/09/2015    Past Surgical History:  Procedure Laterality Date  . NO PAST SURGERIES    . TUBAL LIGATION Bilateral 07/09/2015   Procedure: POST PARTUM TUBAL LIGATION;  Surgeon: Olga Millers, MD;  Location: [facility];  Service: Gynecology;  Laterality: Bilateral;    Family History  Problem Relation Age of Onset  . Alcohol abuse Neg Hx     Social History   Socioeconomic History  . Marital status: Married    Spouse name: Not on file  . Number of children: Not on file  . Years of education: Not on file  . Highest education level: Not on file  Occupational History  . Not on file  Social Needs  . Financial resource strain: Not on file  . Food insecurity:  Worry: Not on file    Inability: Not on file  . Transportation needs:    Medical: Not on file    Non-medical: Not on file  Tobacco Use  . Smoking status: Never Smoker  . Smokeless tobacco: Never Used  Substance and Sexual Activity  . Alcohol use: No  . Drug use: No  . Sexual activity: Not on file  Lifestyle  . Physical activity:    Days per week: Not on file    Minutes per session: Not on file  . Stress: Not on file  Relationships  . Social connections:    Talks on phone: Not on file    Gets together: Not on file    Attends religious service: Not on file    Active member of club or organization: Not  on file    Attends meetings of clubs or organizations: Not on file    Relationship status: Not on file  . Intimate partner violence:    Fear of current or ex partner: Not on file    Emotionally abused: Not on file    Physically abused: Not on file    Forced sexual activity: Not on file  Other Topics Concern  . Not on file  Social History Narrative  . Not on file    No current outpatient medications on file.  No Known Allergies   ROS  Constitutional: Negative for fever or weight change.  Respiratory: Negative for cough and shortness of breath.   Cardiovascular: Negative for chest pain or palpitations.  Gastrointestinal: Negative for abdominal pain, no bowel changes.  Musculoskeletal: Negative for gait problem or joint swelling.  Skin: Negative for rash.  Neurological: Negative for dizziness or headache.  No other specific complaints in a complete review of systems (except as listed in HPI above).   Objective  Vitals:   01/08/18 0917  BP: 118/72  Pulse: 69  Resp: 16  Temp: 98.8 F (37.1 C)  TempSrc: Oral  SpO2: 97%  Weight: 122 lb 3.2 oz (55.4 kg)  Height: 5\' 4"  (1.626 m)    Body mass index is 20.98 kg/m.  Physical Exam Vital signs reviewed. Constitutional: Patient appears well-developed and well-nourished. No distress.  HENT: Head: Normocephalic and atraumatic. Ears: B TMs ok, no erythema or effusion; Nose: Nose normal. Mouth/Throat: Oropharynx is clear and moist. No oropharyngeal exudate.  Eyes: Conjunctivae and EOM are normal. Pupils are equal, round, and reactive to light. No scleral icterus.  Neck: Normal range of motion. Neck supple. No cervical adenopathy. No thyromegaly present.  Cardiovascular: Normal rate, regular rhythm and normal heart sounds.  No murmur heard. No BLE edema. Distal pulses intact. Pulmonary/Chest: Effort normal and breath sounds normal. No respiratory distress. Abdominal: Soft. Bowel sounds are normal, no distension. There is no  tenderness. no masses Musculoskeletal: Normal range of motion. No gross deformities Neurological: She is alert and oriented to person, place, and time. No cranial nerve deficit. Coordination, balance, strength, speech and gait are normal.  Skin: Skin is warm and dry. No rash noted. No erythema. Dark, thickened skin to mid back. Psychiatric: Patient has a normal mood and affect. behavior is normal. Judgment and thought content normal.  Fall Risk: Fall Risk  01/08/2018  Falls in the past year? No    Assessment & Plan RTC in about 6 months for F/U: anemia CBC done 12/11/17 WNL

## 2018-01-08 NOTE — Assessment & Plan Note (Addendum)
-  USPSTF grade A and B recommendations reviewed with patient; age-appropriate recommendations, preventive care, screening tests, etc discussed and encouraged; healthy living and sunscreen use encouraged; see AVS for patient education given to patient. Advanced directives packet declined. -Discussed importance of 150 minutes of physical activity weekly,eat 6 servings of fruit/vegetables daily and drink plenty of water and avoid sweet beverages.  -Follow up and care instructions discussed and provided in AVS.  -Reviewed Health Maintenance:  Need for Tdap vaccination- Tdap vaccine greater than or equal to 7yo IM  Screening for cholesterol level She is fasting - Lipid panel; Future  Elevated glucose ?acanthosis nigricans noted on exam - Comprehensive metabolic panel; Future - Hemoglobin A1c; Future - TSH; Future

## 2018-07-27 ENCOUNTER — Ambulatory Visit: Payer: BLUE CROSS/BLUE SHIELD | Admitting: Nurse Practitioner

## 2018-08-10 ENCOUNTER — Encounter: Payer: Self-pay | Admitting: Nurse Practitioner

## 2018-08-10 ENCOUNTER — Other Ambulatory Visit (INDEPENDENT_AMBULATORY_CARE_PROVIDER_SITE_OTHER): Payer: 59

## 2018-08-10 ENCOUNTER — Ambulatory Visit: Payer: 59 | Admitting: Nurse Practitioner

## 2018-08-10 VITALS — BP 90/52 | HR 87 | Ht 64.0 in | Wt 120.0 lb

## 2018-08-10 DIAGNOSIS — R17 Unspecified jaundice: Secondary | ICD-10-CM | POA: Diagnosis not present

## 2018-08-10 DIAGNOSIS — D649 Anemia, unspecified: Secondary | ICD-10-CM | POA: Diagnosis not present

## 2018-08-10 LAB — FERRITIN: Ferritin: 11.2 ng/mL (ref 10.0–291.0)

## 2018-08-10 LAB — COMPREHENSIVE METABOLIC PANEL
ALK PHOS: 40 U/L (ref 39–117)
ALT: 8 U/L (ref 0–35)
AST: 13 U/L (ref 0–37)
Albumin: 4.5 g/dL (ref 3.5–5.2)
BILIRUBIN TOTAL: 0.8 mg/dL (ref 0.2–1.2)
BUN: 11 mg/dL (ref 6–23)
CO2: 29 meq/L (ref 19–32)
Calcium: 9.9 mg/dL (ref 8.4–10.5)
Chloride: 104 mEq/L (ref 96–112)
Creatinine, Ser: 0.71 mg/dL (ref 0.40–1.20)
GFR: 120.46 mL/min (ref >60.00)
Glucose, Bld: 87 mg/dL (ref 70–99)
Potassium: 3.9 mEq/L (ref 3.5–5.1)
Sodium: 140 mEq/L (ref 135–145)
TOTAL PROTEIN: 7.7 g/dL (ref 6.0–8.3)

## 2018-08-10 LAB — CBC
HCT: 40.7 % (ref 36.0–46.0)
Hemoglobin: 13.8 g/dL (ref 12.0–15.0)
MCHC: 34 g/dL (ref 30.0–36.0)
MCV: 90.7 fl (ref 78.0–100.0)
Platelets: 199 10*3/uL (ref 150.0–400.0)
RBC: 4.49 Mil/uL (ref 3.87–5.11)
RDW: 12.8 % (ref 11.5–15.5)
WBC: 3.3 10*3/uL — AB (ref 4.0–10.5)

## 2018-08-10 LAB — IRON: Iron: 68 ug/dL (ref 42–145)

## 2018-08-10 NOTE — Assessment & Plan Note (Signed)
Continue ferrous sulfate Update labs F/U with further recommendations pending lab results - CBC; Future - Ferritin; Future - Iron; Future

## 2018-08-10 NOTE — Patient Instructions (Signed)
Head downstairs for lab work today.  I will see you back in 6 months for annual physical, or sooner if needed!

## 2018-08-10 NOTE — Progress Notes (Signed)
  Cassandra Vargas is a 37 y.o. female with the following history as recorded in Epic[employer]:  Patient Active Problem List   Diagnosis Date Noted  . Routine general medical examination at a health care facility 01/08/2018  . Anemia 09/29/2017  . Indication for care in labor or delivery 07/09/2015  . Encounter for supervision of normal pregnancy in multigravida 07/09/2015    Current Outpatient Medications  Medication Sig Dispense Refill  . ferrous sulfate 325 (65 FE) MG tablet Take 1 tablet by mouth daily.     No current facility-administered medications for this visit.     Allergies: Patient has no known allergies.  Past Medical History:  Diagnosis Date  . Anxiety   . Medical history non-contributory     Past Surgical History:  Procedure Laterality Date  . NO PAST SURGERIES    . TUBAL LIGATION Bilateral 07/09/2015   Procedure: POST PARTUM TUBAL LIGATION;  Surgeon: Olga Millers, MD;  Location: [facility];  Service: Gynecology;  Laterality: Bilateral;    Family History  Problem Relation Age of Onset  . Alcohol abuse Neg Hx     Social History   Tobacco Use  . Smoking status: Never Smoker  . Smokeless tobacco: Never Used  Substance Use Topics  . Alcohol use: No     Subjective:  Here today for anemia follow up, last saw her in April for follow up of this and her labs showed normal iron level and blood counts, improving ferritin, she was instructed to continue ferritin supplement, taking ferrous sulfate 325 qod which she tells me today she has continued. She was asked to return in about 6 months to have labs rechecked for stability, here today for this follow up. We wlil also check CMET today for follow up of elevated bilibrubin noted on 01/08/18 CPE labs. She says she is feeling well today, without complaints She denies fevers, chills, weakness, dizziness. cp, sob, abdominal pain, nausea, vomiting, abnormal bleeding  ROS- See HPI  Objective:  Vitals:   08/10/18 1040  BP: (!)  90/52  Pulse: 87  SpO2: 98%  Weight: 120 lb (54.4 kg)  Height: 5\' 4"  (1.626 m)    General: Well developed, well nourished, in no acute distress  Skin : Warm and dry. No rash or pallor. Head: Normocephalic and atraumatic  Eyes: Sclera and conjunctiva clear; pupils round and reactive to light; extraocular movements intact  Oropharynx: Pink, supple. No suspicious lesions  Neck: Supple without thyromegaly Lungs: Respirations unlabored; clear to auscultation bilaterally without wheeze, rales, rhonchi  CVS exam: normal rate, regular rhythm, normal S1, S2, no murmurs, rubs, clicks or gallops.  Abdomen: Soft; nontender; nondistended; normoactive bowel sounds; no masses or hepatosplenomegaly  Extremities: No edema, cyanosis  Vessels: Symmetric bilaterally  Neurologic: Alert and oriented; speech intact; face symmetrical; moves all extremities well; CNII-XII intact without focal deficit  Psychiatric: Normal mood and affect.  Assessment:  1. Anemia, unspecified type   2. Elevated bilirubin     Plan:   Elevated bilirubin Recheck labs F/U with further recommendations pending lab results - Comprehensive metabolic panel; Future  No follow-ups on file.  No orders of the defined types were placed in this encounter.   Requested Prescriptions    No prescriptions requested or ordered in this encounter

## 2018-08-14 ENCOUNTER — Encounter: Payer: Self-pay | Admitting: *Deleted

## 2018-08-14 ENCOUNTER — Telehealth: Payer: Self-pay | Admitting: Nurse Practitioner

## 2018-08-14 DIAGNOSIS — R790 Abnormal level of blood mineral: Secondary | ICD-10-CM

## 2018-08-14 NOTE — Telephone Encounter (Signed)
Copied from [place] [id number]. Topic: Quick Communication - See Telephone Encounter >> Aug 14, 2018 11:22 AM Burchel, Abbi R wrote: CRM for notification. See Telephone encounter for: 08/14/18.  Pt states she would like a referral for low iron per [employer]   Please advise.    [phone]

## 2018-08-17 NOTE — Telephone Encounter (Signed)
Referral entered  

## 2018-08-21 ENCOUNTER — Encounter: Payer: Self-pay | Admitting: Adult Health

## 2018-08-21 ENCOUNTER — Telehealth: Payer: Self-pay | Admitting: Adult Health

## 2018-08-21 NOTE — Telephone Encounter (Signed)
A new referral received from Caesar Chestnut, NP for low ferritin. Pt has been cld and scheduled to see Wilber Bihari on 1/10 at 830am. Aware to arrive 30 minutes early.

## 2018-08-27 ENCOUNTER — Other Ambulatory Visit: Payer: Self-pay | Admitting: Adult Health

## 2018-08-27 DIAGNOSIS — D649 Anemia, unspecified: Secondary | ICD-10-CM

## 2018-08-28 ENCOUNTER — Telehealth: Payer: Self-pay | Admitting: Adult Health

## 2018-08-28 ENCOUNTER — Inpatient Hospital Stay: Payer: 59 | Attending: Adult Health | Admitting: Adult Health

## 2018-08-28 ENCOUNTER — Inpatient Hospital Stay: Payer: 59

## 2018-08-28 ENCOUNTER — Encounter: Payer: Self-pay | Admitting: Adult Health

## 2018-08-28 VITALS — BP 110/77 | HR 99 | Temp 98.2°F | Resp 20 | Ht 64.0 in | Wt 121.5 lb

## 2018-08-28 DIAGNOSIS — Z79899 Other long term (current) drug therapy: Secondary | ICD-10-CM | POA: Insufficient documentation

## 2018-08-28 DIAGNOSIS — D508 Other iron deficiency anemias: Secondary | ICD-10-CM

## 2018-08-28 DIAGNOSIS — D509 Iron deficiency anemia, unspecified: Secondary | ICD-10-CM | POA: Insufficient documentation

## 2018-08-28 DIAGNOSIS — N920 Excessive and frequent menstruation with regular cycle: Secondary | ICD-10-CM | POA: Insufficient documentation

## 2018-08-28 DIAGNOSIS — E611 Iron deficiency: Secondary | ICD-10-CM | POA: Diagnosis not present

## 2018-08-28 DIAGNOSIS — D649 Anemia, unspecified: Secondary | ICD-10-CM

## 2018-08-28 LAB — CBC WITH DIFFERENTIAL (CANCER CENTER ONLY)
Abs Immature Granulocytes: 0.02 10*3/uL (ref 0.00–0.07)
Basophils Absolute: 0.1 10*3/uL (ref 0.0–0.1)
Basophils Relative: 1 %
EOS ABS: 0.1 10*3/uL (ref 0.0–0.5)
Eosinophils Relative: 1 %
HCT: 42 % (ref 36.0–46.0)
Hemoglobin: 13.8 g/dL (ref 12.0–15.0)
Immature Granulocytes: 0 %
Lymphocytes Relative: 20 %
Lymphs Abs: 1.4 10*3/uL (ref 0.7–4.0)
MCH: 30.3 pg (ref 26.0–34.0)
MCHC: 32.9 g/dL (ref 30.0–36.0)
MCV: 92.3 fL (ref 80.0–100.0)
Monocytes Absolute: 0.5 10*3/uL (ref 0.1–1.0)
Monocytes Relative: 7 %
NEUTROS PCT: 71 %
Neutro Abs: 5.2 10*3/uL (ref 1.7–7.7)
Platelet Count: 307 10*3/uL (ref 150–400)
RBC: 4.55 MIL/uL (ref 3.87–5.11)
RDW: 11.9 % (ref 11.5–15.5)
WBC Count: 7.3 10*3/uL (ref 4.0–10.5)
nRBC: 0 % (ref 0.0–0.2)

## 2018-08-28 LAB — IRON AND TIBC
Iron: 119 ug/dL (ref 41–142)
Saturation Ratios: 38 % (ref 21–57)
TIBC: 316 ug/dL (ref 236–444)
UIBC: 198 ug/dL (ref 120–384)

## 2018-08-28 LAB — VITAMIN B12: Vitamin B-12: 307 pg/mL (ref 180–914)

## 2018-08-28 LAB — SAVE SMEAR(SSMR), FOR PROVIDER SLIDE REVIEW

## 2018-08-28 LAB — RETIC PANEL
Immature Retic Fract: 8.3 % (ref 2.3–15.9)
RBC.: 4.55 MIL/uL (ref 3.87–5.11)
Retic Count, Absolute: 54.6 10*3/uL (ref 19.0–186.0)
Retic Ct Pct: 1.2 % (ref 0.4–3.1)
Reticulocyte Hemoglobin: 36.1 pg (ref >27.9)

## 2018-08-28 LAB — FERRITIN: Ferritin: 22 ng/mL (ref 11–307)

## 2018-08-28 LAB — FOLATE: Folate: 20.3 ng/mL (ref >5.9)

## 2018-08-28 NOTE — Telephone Encounter (Signed)
Gave avs and calendar ° °

## 2018-08-28 NOTE — Progress Notes (Addendum)
[place]  [phone] [phone]     ID: Cassandra Vargas DOB: [birth year]  MR#: [id number]  JYN#:[id number]  Patient Care Team: Lance Sell, NP as PCP - General (Nurse Practitioner) Scot Dock, NP OTHER MD:  CHIEF COMPLAINT: iron deficiency  CURRENT TREATMENT: oral iron supplementation   HISTORY OF CURRENT ILLNESS: Cassandra Vargas is a 38 year old woman who was first diagnosed with iron deficiency in 2017.  She is currently taking iron supplements, and has been on and off supplements over the past two years.  She says that in the past she has been symptomatic with her iron deficiency with fatigue and shortness of breath.    The patient's subsequent history is as detailed below.  INTERVAL HISTORY:  Cassandra Vargas is doing well today.  She is taking her iron supplements with ferrous sulfate three times per week.  She tolerates this well and denies any nausea, constipation, or diarrhea after taking these.    REVIEW OF SYSTEMS:  Cassandra Vargas denies any easy bruising/bleeding, or lymphadenopathy.  She has not had any melena or hematochezia.  She is without dysphagia, indigestion, nausea, or vomiting.  She denies unusual headaches, or vision changes.  She has normal menstrual cycles once every 28 days, that are normal in duration of 7 days, and she has a 3 pad count for two days, and then it will decrease to 1 per day as it slowly decreases until it stops on day 7.  She denies chest pain, palpitations, cough, shortness of breath.  A detailed ROS was otherwise non contributory today.    PAST MEDICAL HISTORY: Past Medical History:  Diagnosis Date  . Anxiety   . Medical history non-contributory     PAST SURGICAL HISTORY: Past Surgical History:  Procedure Laterality Date  . NO PAST SURGERIES    . TUBAL LIGATION Bilateral 07/09/2015   Procedure: POST PARTUM TUBAL LIGATION;  Surgeon: Olga Millers, MD;  Location: [facility];  Service: Gynecology;  Laterality:  Bilateral;    FAMILY HISTORY Family History  Problem Relation Age of Onset  . Alcohol abuse Neg Hx     GYNECOLOGIC HISTORY:  No LMP recorded. Age at first live birth: 38 years old G70 P 4 Regular menstrual cycles every 28 days, 7 days long, 3 pad count on heaviest days    SOCIAL HISTORY:  Married and lives with husband and four children ages 48, 48, 7, and 65 in [place], [place].  She is a stay at home mom.    ADVANCED DIRECTIVES:    HEALTH MAINTENANCE: Social History   Tobacco Use  . Smoking status: Never Smoker  . Smokeless tobacco: Never Used  Substance Use Topics  . Alcohol use: No  . Drug use: No     Colonoscopy:  PAP:  Bone density:   No Known Allergies  Current Outpatient Medications  Medication Sig Dispense Refill  . ferrous sulfate 325 (65 FE) MG tablet Take 1 tablet by mouth daily.     No current facility-administered medications for this visit.     OBJECTIVE:  Vitals:   08/28/18 0855  BP: 110/77  Pulse: 99  Resp: 20  Temp: 98.2 F (36.8 C)  SpO2: 100%     Body mass index is 20.86 kg/m.   Wt Readings from Last 3 Encounters:  08/28/18 121 lb 8 oz (55.1 kg)  08/10/18 120 lb (54.4 kg)  01/08/18 122 lb 3.2 oz (55.4 kg)  ECOG FS:0 - Asymptomatic GENERAL: Patient is a  well appearing female in no acute distress HEENT:  Sclerae anicteric.  Oropharynx clear and moist. No ulcerations or evidence of oropharyngeal candidiasis. Neck is supple.  NODES:  No cervical, supraclavicular, or axillary lymphadenopathy palpated.  LUNGS:  Clear to auscultation bilaterally.  No wheezes or rhonchi. HEART:  Regular rate and rhythm. No murmur appreciated. ABDOMEN:  Soft, nontender.  Positive, normoactive bowel sounds. No organomegaly palpated. MSK:  No focal spinal tenderness to palpation.  EXTREMITIES:  No peripheral edema.   SKIN:  Clear with no obvious rashes or skin changes. No nail dyscrasia. NEURO:  Nonfocal. Well oriented.  Appropriate affect.    LAB  RESULTS:  CMP     Component Value Date/Time   NA 140 08/10/2018 1058   K 3.9 08/10/2018 1058   CL 104 08/10/2018 1058   CO2 29 08/10/2018 1058   GLUCOSE 87 08/10/2018 1058   BUN 11 08/10/2018 1058   CREATININE 0.71 08/10/2018 1058   CALCIUM 9.9 08/10/2018 1058   PROT 7.7 08/10/2018 1058   ALBUMIN 4.5 08/10/2018 1058   AST 13 08/10/2018 1058   ALT 8 08/10/2018 1058   ALKPHOS 40 08/10/2018 1058   BILITOT 0.8 08/10/2018 1058   GFRNONAA >60 02/28/2016 1631   GFRAA >60 02/28/2016 1631    No results found for: TOTALPROTELP, ALBUMINELP, A1GS, A2GS, BETS, BETA2SER, GAMS, MSPIKE, SPEI  No results found for: Nils Pyle, [facility]  Lab Results  Component Value Date   WBC 7.3 08/28/2018   NEUTROABS 5.2 08/28/2018   HGB 13.8 08/28/2018   HCT 42.0 08/28/2018   MCV 92.3 08/28/2018   PLT 307 08/28/2018      Chemistry      Component Value Date/Time   NA 140 08/10/2018 1058   K 3.9 08/10/2018 1058   CL 104 08/10/2018 1058   CO2 29 08/10/2018 1058   BUN 11 08/10/2018 1058   CREATININE 0.71 08/10/2018 1058      Component Value Date/Time   CALCIUM 9.9 08/10/2018 1058   ALKPHOS 40 08/10/2018 1058   AST 13 08/10/2018 1058   ALT 8 08/10/2018 1058   BILITOT 0.8 08/10/2018 1058       No results found for: LABCA2  No components found for: XNATFT732  No results for input(s): INR in the last 168 hours.  No results found for: LABCA2  No results found for: KGU542  No results found for: HCW237  No results found for: SEG315  No results found for: CA2729  No components found for: HGQUANT  No results found for: CEA1 / No results found for: CEA1   No results found for: AFPTUMOR  No results found for: CHROMOGRNA  No results found for: PSA1  Appointment on 08/28/2018  Component Date Value Ref Range Status  . WBC Count 08/28/2018 7.3  4.0 - 10.5 K/uL Final  . RBC 08/28/2018 4.55  3.87 - 5.11 MIL/uL Final  . Hemoglobin 08/28/2018 13.8  12.0 - 15.0 g/dL  Final  . HCT 08/28/2018 42.0  36.0 - 46.0 % Final  . MCV 08/28/2018 92.3  80.0 - 100.0 fL Final  . MCH 08/28/2018 30.3  26.0 - 34.0 pg Final  . MCHC 08/28/2018 32.9  30.0 - 36.0 g/dL Final  . RDW 08/28/2018 11.9  11.5 - 15.5 % Final  . Platelet Count 08/28/2018 307  150 - 400 K/uL Final  . nRBC 08/28/2018 0.0  0.0 - 0.2 % Final  . Neutrophils Relative % 08/28/2018 71  % Final  . Neutro Abs 08/28/2018  5.2  1.7 - 7.7 K/uL Final  . Lymphocytes Relative 08/28/2018 20  % Final  . Lymphs Abs 08/28/2018 1.4  0.7 - 4.0 K/uL Final  . Monocytes Relative 08/28/2018 7  % Final  . Monocytes Absolute 08/28/2018 0.5  0.1 - 1.0 K/uL Final  . Eosinophils Relative 08/28/2018 1  % Final  . Eosinophils Absolute 08/28/2018 0.1  0.0 - 0.5 K/uL Final  . Basophils Relative 08/28/2018 1  % Final  . Basophils Absolute 08/28/2018 0.1  0.0 - 0.1 K/uL Final  . Immature Granulocytes 08/28/2018 0  % Final  . Abs Immature Granulocytes 08/28/2018 0.02  0.00 - 0.07 K/uL Final   Performed at [facility], [place] [address], [place], [place] [address]  . Smear Review 08/28/2018 SMEAR STAINED AND AVAILABLE FOR REVIEW   Final   Performed at [facility], [address] [place], [place] [address]  . Retic Ct Pct 08/28/2018 1.2  0.4 - 3.1 % Final  . RBC. 08/28/2018 4.55  3.87 - 5.11 MIL/uL Final  . Retic Count, Absolute 08/28/2018 54.6  19.0 - 186.0 K/uL Final  . Immature Retic Fract 08/28/2018 8.3  2.3 - 15.9 % Final  . Reticulocyte Hemoglobin 08/28/2018 36.1  >27.9 pg Final   Comment:        Given the high negative predictive value of a RET-He result > 32 pg iron deficiency is essentially excluded. If this patient is anemic other etiologies should be considered. Performed at [facility], [place] [address] [place], [place] [address]     (this displays the last labs from the last 3 days)  No results found for: TOTALPROTELP, ALBUMINELP, A1GS,  A2GS, BETS, BETA2SER, GAMS, MSPIKE, SPEI (this displays SPEP labs)  No results found for: KPAFRELGTCHN, LAMBDASER, KAPLAMBRATIO (kappa/lambda light chains)  No results found for: HGBA, HGBA2QUANT, HGBFQUANT, HGBSQUAN (Hemoglobinopathy evaluation)   No results found for: LDH  Lab Results  Component Value Date   IRON 68 08/10/2018   (Iron and TIBC)  Lab Results  Component Value Date   FERRITIN 11.2 08/10/2018    Urinalysis    Component Value Date/Time   COLORURINE YELLOW 10/11/2015 [place] 10/11/2015 1416   LABSPEC 1.007 10/11/2015 1416   PHURINE 7.0 10/11/2015 1416   [place] 10/11/2015 1416   [place] 10/11/2015 1416   [place] 10/11/2015 1416   [place] 10/11/2015 1416   PROTEINUR NEGATIVE 10/11/2015 1416   NITRITE NEGATIVE 10/11/2015 1416   LEUKOCYTESUR NEGATIVE 10/11/2015 1416     STUDIES: No results found.    ASSESSMENT: 38 y.o. with h/o iron deficiency currently on oral supplementation with iron three times per week.    1. Continue oral iron supplementation and will recheck in 3 and 6 months   PLAN:   Cassandra Vargas met with Dr. Jana Hakim today to review her iron deficiency.  Her hemoglobin is stable.  Iron levels pending.  She was recommended to continue oral iron daily to see if that will help to increase her ferritin levels.   We will recheck her labs in 3 and 6 months and at that time will determine whether or not feraheme is indicated.  We will call her with her results.    Cassandra Vargas knows to call for any questions or concerns prior to her next appointment with [place].     Scot Dock, NP   08/28/2018 9:11 AM Medical Oncology and Hematology [address]  [place], [place] [address] Tel. [phone]    Fax. [phone]   ADDENDUM: 38 y/o [place] woman with a history of iron deficiency dating back to 2017. She has regular but heavy meanstrual cycles.  She tolerates oral  iron 1 tablet 3 times a week without significant side effects.  In the absence of any GI symptoms I do not think additional work-up is warranted: Her menorrhagia sufficiently explains the iron deficiency.  By taking iron 3 times a week she has been able to avoid frank anemia: Results for JULES, VIDOVICH (MRN [id number]) as of 08/28/2018 17:28  Ref. Range 02/28/2016 16:31 09/29/2017 13:39 12/11/2017 10:32 08/10/2018 10:58 08/28/2018 08:37  Hemoglobin Latest Ref Range: 12.0 - 15.0 g/dL 13.0 13.6 14.7 13.8 13.8   However her ferritin remains in the low range, indicating low iron stores. Results for CHANDA, LAPERLE (MRN [id number]) as of 08/28/2018 17:28  Ref. Range 09/29/2017 13:39 12/11/2017 10:32 08/10/2018 10:58 08/28/2018 08:37  Ferritin Latest Ref Range: 11 - 307 ng/mL 9.7 (L) 9.9 (L) 11.2 22   Even though she is not anemic, iron is necessary for many of the enzymatic functions of the body and low iron can affect general wellbeing even in the absence of anemia.  I discussed with Renea that once we take an iron the body fights to keep it and only loses it through bleeding.  In her case we know why she is bleeding and she is going to continue bleeding until she is menopausal.  Accordingly she will need to continue on iron supplementation until she stops having periods, namely until menopause.  By taking 1 tablet 3 times a week she has been able to avoid frank anemia but she has not been able to replenish her iron stores.  Accordingly if she goes to 1 tablet daily her iron stores should improve and that was our chief recommendation.  I would anticipate that after another year on daily iron her ferritin will be above 50 and possibly above 100.  We will be checking her lab work in April and July and assuming we find the anticipated results, further visits here should not be necessary  I personally saw this patient and performed a substantive portion of this encounter with the listed APP documented above.    Chauncey Cruel, MD Medical Oncology and Hematology [facility] [address] [place], [place] [address] Tel. [phone]    Fax. [phone]

## 2018-10-29 ENCOUNTER — Emergency Department
Admission: EM | Admit: 2018-10-29 | Discharge: 2018-10-29 | Disposition: A | Discharge status: Home / Self Care | Payer: 59 | Attending: Emergency Medicine | Admitting: Emergency Medicine

## 2018-10-29 ENCOUNTER — Encounter: Payer: Self-pay | Admitting: Emergency Medicine

## 2018-10-29 ENCOUNTER — Other Ambulatory Visit: Payer: Self-pay

## 2018-10-29 DIAGNOSIS — Z79899 Other long term (current) drug therapy: Secondary | ICD-10-CM | POA: Diagnosis not present

## 2018-10-29 DIAGNOSIS — N390 Urinary tract infection, site not specified: Secondary | ICD-10-CM | POA: Insufficient documentation

## 2018-10-29 DIAGNOSIS — R300 Dysuria: Secondary | ICD-10-CM | POA: Diagnosis present

## 2018-10-29 LAB — URINALYSIS, COMPLETE (UACMP) WITH MICROSCOPIC
Bacteria, UA: NONE SEEN
Bilirubin Urine: NEGATIVE
Glucose, UA: NEGATIVE mg/dL
Ketones, ur: NEGATIVE mg/dL
Nitrite: NEGATIVE
Protein, ur: 30 mg/dL — AB
RBC / HPF: >50 RBC/hpf — ABNORMAL HIGH (ref 0–5)
Specific Gravity, Urine: 1.023 (ref 1.005–1.030)
WBC, UA: >50 WBC/hpf — ABNORMAL HIGH (ref 0–5)
pH: 6 (ref 5.0–8.0)

## 2018-10-29 LAB — POCT PREGNANCY, URINE: PREG TEST UR: NEGATIVE

## 2018-10-29 MED ORDER — NITROFURANTOIN MONOHYD MACRO 100 MG PO CAPS: 100.0000 mg | ORAL_CAPSULE | Freq: Two times a day (BID) | ORAL | 0 refills | Status: DC

## 2018-10-29 NOTE — ED Provider Notes (Signed)
[facility] Emergency Department Provider Note  ____________________________________________   First MD Initiated Contact with Patient 10/29/18 2052     (approximate)  I have reviewed the triage vital signs and the nursing notes.   HISTORY  Chief Complaint Dysuria    HPI Cassandra Vargas is a 38 y.o. female presents to the emergency department complaining of UTI symptoms.  She complains of dysuria and hematuria.  She states that symptoms started 1 to 2 days ago.  She denies any fever, chills, back pain, nausea, vomiting, or vaginal discharge.    Past Medical History:  Diagnosis Date  . Anxiety   . Medical history non-contributory     Patient Active Problem List   Diagnosis Date Noted  . Iron deficiency anemia 08/28/2018  . Menorrhagia with regular cycle 08/28/2018  . Routine general medical examination at a health care facility 01/08/2018  . Anemia 09/29/2017  . Indication for care in labor or delivery 07/09/2015  . Encounter for supervision of normal pregnancy in multigravida 07/09/2015    Past Surgical History:  Procedure Laterality Date  . NO PAST SURGERIES    . TUBAL LIGATION Bilateral 07/09/2015   Procedure: POST PARTUM TUBAL LIGATION;  Surgeon: Olga Millers, MD;  Location: [facility];  Service: Gynecology;  Laterality: Bilateral;    Prior to Admission medications   Medication Sig Start Date End Date Taking? Authorizing Provider  ferrous sulfate 325 (65 FE) MG tablet Take 1 tablet by mouth daily.    [provider]  nitrofurantoin, macrocrystal-monohydrate, (MACROBID) 100 MG capsule Take 1 capsule (100 mg total) by mouth 2 (two) times daily. 10/29/18   Versie Starks, PA-C    Allergies Patient has no known allergies.  Family History  Problem Relation Age of Onset  . Crohn's disease Sister   . Crohn's disease Maternal Aunt   . Alcohol abuse Neg Hx   . Colon cancer Neg Hx   . Breast cancer Neg Hx     Social History  Social History   Tobacco Use  . Smoking status: Never Smoker  . Smokeless tobacco: Never Used  Substance Use Topics  . Alcohol use: No  . Drug use: No    Review of Systems  Constitutional: No fever/chills Eyes: No visual changes. ENT: No sore throat. Respiratory: Denies cough Genitourinary: Positive for dysuria. Musculoskeletal: Negative for back pain. Skin: Negative for rash.    ____________________________________________   PHYSICAL EXAM:  VITAL SIGNS: ED Triage Vitals  Enc Vitals Group     BP 10/29/18 2004 107/62     Pulse Rate 10/29/18 2004 79     Resp 10/29/18 2004 18     Temp 10/29/18 2004 98.3 F (36.8 C)     Temp Source 10/29/18 2004 Oral     SpO2 10/29/18 2004 98 %     Weight 10/29/18 2003 120 lb (54.4 kg)     Height 10/29/18 2003 5\' 4"  (1.626 m)     Head Circumference --      Peak Flow --      Pain Score 10/29/18 2002 1     Pain Loc --      Pain Edu? --      Excl. in [place]? --     Constitutional: Alert and oriented. Well appearing and in no acute distress. Eyes: Conjunctivae are normal.  Head: Atraumatic. Nose: No congestion/rhinnorhea. Mouth/Throat: Mucous membranes are moist.   Neck:  supple no lymphadenopathy noted Cardiovascular: Normal rate, regular rhythm. Heart sounds  are normal Respiratory: Normal respiratory effort.  No retractions, lungs c t a  Abd: soft nontender bs normal all 4 quad, no CVA tenderness GU: deferred Musculoskeletal: FROM all extremities, warm and well perfused Neurologic:  Normal speech and language.  Skin:  Skin is warm, dry and intact. No rash noted. Psychiatric: Mood and affect are normal. Speech and behavior are normal.  ____________________________________________   LABS (all labs ordered are listed, but only abnormal results are displayed)  Labs Reviewed  URINALYSIS, COMPLETE (UACMP) WITH MICROSCOPIC - Abnormal; Notable for the following components:      Result Value   Color, Urine YELLOW (*)    APPearance  CLOUDY (*)    Hgb urine dipstick LARGE (*)    Protein, ur 30 (*)    Leukocytes,Ua LARGE (*)    RBC / HPF >50 (*)    WBC, UA >50 (*)    All other components within normal limits  POCT PREGNANCY, URINE   ____________________________________________   ____________________________________________  RADIOLOGY    ____________________________________________   PROCEDURES  Procedure(s) performed: No  Procedures    ____________________________________________   INITIAL IMPRESSION / ASSESSMENT AND PLAN / ED COURSE  Pertinent labs & imaging results that were available during my care of the patient were reviewed by me and considered in my medical decision making (see chart for details).   Patient is a 38 year old female presents emergency department with UTI symptoms.  Physical exam is basically unremarkable  UA shows large amount of leuks greater than 50 WBCs, greater than 50 RBCs  Discussed the urine results with the patient and her husband.  She was given a prescription for Macrobid.  She is to follow-up with her regular doctor if not better in 3 days or return emergency department worsening.  She states she understands and will comply.  She is to increase her water intake and try cranberry juice or cranberry pills.  She was discharged in stable condition.     As part of my medical decision making, I reviewed the following data within the [facility] notes reviewed and incorporated, Labs reviewed UA positive for infection, Old chart reviewed, Notes from prior ED visits and [place] Controlled Substance Database  ____________________________________________   FINAL CLINICAL IMPRESSION(S) / ED DIAGNOSES  Final diagnoses:  Acute UTI      NEW MEDICATIONS STARTED DURING THIS VISIT:  Discharge Medication List as of 10/29/2018  9:01 PM    START taking these medications   Details  nitrofurantoin, macrocrystal-monohydrate, (MACROBID) 100 MG capsule Take  1 capsule (100 mg total) by mouth 2 (two) times daily., Starting Thu 10/29/2018, Normal         Note:  This document was prepared using Dragon voice recognition software and may include unintentional dictation errors.    Versie Starks, PA-C 10/29/18 2248    Harvest Dark, MD 10/29/18 [phone]

## 2018-10-29 NOTE — ED Triage Notes (Signed)
Patient ambulatory to triage with steady gait, without difficulty or distress noted; pt reports today having dysuria; denies abd or back pain

## 2018-10-29 NOTE — ED Notes (Signed)
Pt unable to esign, non-functional pad.

## 2018-10-29 NOTE — Discharge Instructions (Addendum)
Follow up with your regular doctor if not better in 3 days.  Return if worsening. Drink plenty of water

## 2018-11-27 ENCOUNTER — Other Ambulatory Visit: Payer: 59

## 2018-12-25 ENCOUNTER — Inpatient Hospital Stay: Payer: 59 | Attending: Adult Health

## 2018-12-25 ENCOUNTER — Other Ambulatory Visit: Payer: Self-pay

## 2018-12-25 DIAGNOSIS — E611 Iron deficiency: Secondary | ICD-10-CM | POA: Insufficient documentation

## 2018-12-25 DIAGNOSIS — D508 Other iron deficiency anemias: Secondary | ICD-10-CM

## 2018-12-25 LAB — CBC WITH DIFFERENTIAL (CANCER CENTER ONLY)
Abs Immature Granulocytes: 0 10*3/uL (ref 0.00–0.07)
Basophils Absolute: 0 10*3/uL (ref 0.0–0.1)
Basophils Relative: 1 %
Eosinophils Absolute: 0.2 10*3/uL (ref 0.0–0.5)
Eosinophils Relative: 4 %
HCT: 43.2 % (ref 36.0–46.0)
Hemoglobin: 14 g/dL (ref 12.0–15.0)
Immature Granulocytes: 0 %
Lymphocytes Relative: 35 %
Lymphs Abs: 1.5 10*3/uL (ref 0.7–4.0)
MCH: 30.1 pg (ref 26.0–34.0)
MCHC: 32.4 g/dL (ref 30.0–36.0)
MCV: 92.9 fL (ref 80.0–100.0)
Monocytes Absolute: 0.3 10*3/uL (ref 0.1–1.0)
Monocytes Relative: 7 %
Neutro Abs: 2.2 10*3/uL (ref 1.7–7.7)
Neutrophils Relative %: 53 %
Platelet Count: 199 10*3/uL (ref 150–400)
RBC: 4.65 MIL/uL (ref 3.87–5.11)
RDW: 11.8 % (ref 11.5–15.5)
WBC Count: 4.1 10*3/uL (ref 4.0–10.5)
nRBC: 0 % (ref 0.0–0.2)

## 2018-12-25 LAB — IRON AND TIBC
Iron: 51 ug/dL (ref 41–142)
Saturation Ratios: 16 % — ABNORMAL LOW (ref 21–57)
TIBC: 323 ug/dL (ref 236–444)
UIBC: 271 ug/dL (ref 120–384)

## 2018-12-25 LAB — FERRITIN: Ferritin: 14 ng/mL (ref 11–307)

## 2018-12-29 ENCOUNTER — Telehealth: Payer: Self-pay | Admitting: *Deleted

## 2018-12-29 NOTE — Telephone Encounter (Signed)
Pt returned VM left by this RN per need for review of iron level.  Cassandra Vargas states she " forgets to take my iron everyday "  She is currently out of iron and " waiting for a shipment "  Per inquiry Cassandra Vargas denies any issues regarding side effects " just forget to take it ".  Iron level discussed as well as best way to keep herself from becoming anemic is to find a way that she can remember to take the iron daily.  Cassandra Vargas verbalized understanding.  This note will be sent to NP per follow up request.

## 2018-12-29 NOTE — Telephone Encounter (Signed)
Please call patient and let her know that I recommend she take the iron regularly as she will continue to lose it if she doesn't make it more of a priority.  Would recommend re check of labs in 3-4 months after regular admin of iron to see if improved.

## 2018-12-29 NOTE — Telephone Encounter (Addendum)
-----   Message from Gardenia Phlegm, NP sent at 12/25/2018 12:02 PM EDT ----- Please call patient and let her know that her iron stores are lower than 3 months ago.  Is she taking iron supplementation daily as we recommended.  Is she able to tolerate it.  May need to get her in for feraheme.    Thanks,  Mendel Ryder ----- Message ----- From: Buel Ream, Lab In [place] Sent: 12/25/2018   9:54 AM EDT To: Gardenia Phlegm, NP  This RN called pt per above and obtained an identified VM - message left requesting a return call per need to verify if she is taking iron daily and discuss lab values showing less iron stores in her body.

## 2018-12-30 ENCOUNTER — Telehealth: Payer: Self-pay

## 2018-12-30 NOTE — Telephone Encounter (Signed)
LVM for patient to call back in regards to NP recommendations regarding iron.

## 2018-12-30 NOTE — Telephone Encounter (Signed)
When is patient lab scheduled?

## 2018-12-30 NOTE — Telephone Encounter (Signed)
Patient returned nurse call.  Recommendations by NP were given and patient voiced understanding.

## 2019-01-01 ENCOUNTER — Telehealth: Payer: Self-pay | Admitting: Adult Health

## 2019-01-01 NOTE — Telephone Encounter (Signed)
Per 5/13 schedule message moved 7/10 lab to 8/17. Left message for patient and mailed schedule.

## 2019-02-26 ENCOUNTER — Other Ambulatory Visit: Payer: 59

## 2019-04-05 ENCOUNTER — Other Ambulatory Visit: Payer: Self-pay

## 2019-04-05 ENCOUNTER — Inpatient Hospital Stay: Payer: 59 | Attending: Adult Health

## 2019-04-05 DIAGNOSIS — E611 Iron deficiency: Secondary | ICD-10-CM | POA: Diagnosis not present

## 2019-04-05 DIAGNOSIS — D508 Other iron deficiency anemias: Secondary | ICD-10-CM

## 2019-04-05 LAB — CBC WITH DIFFERENTIAL (CANCER CENTER ONLY)
Abs Immature Granulocytes: 0.01 10*3/uL (ref 0.00–0.07)
Basophils Absolute: 0.1 10*3/uL (ref 0.0–0.1)
Basophils Relative: 2 %
Eosinophils Absolute: 0.2 10*3/uL (ref 0.0–0.5)
Eosinophils Relative: 5 %
HCT: 41.7 % (ref 36.0–46.0)
Hemoglobin: 13.8 g/dL (ref 12.0–15.0)
Immature Granulocytes: 0 %
Lymphocytes Relative: 38 %
Lymphs Abs: 1.3 10*3/uL (ref 0.7–4.0)
MCH: 30.3 pg (ref 26.0–34.0)
MCHC: 33.1 g/dL (ref 30.0–36.0)
MCV: 91.4 fL (ref 80.0–100.0)
Monocytes Absolute: 0.3 10*3/uL (ref 0.1–1.0)
Monocytes Relative: 9 %
Neutro Abs: 1.6 10*3/uL — ABNORMAL LOW (ref 1.7–7.7)
Neutrophils Relative %: 46 %
Platelet Count: 206 10*3/uL (ref 150–400)
RBC: 4.56 MIL/uL (ref 3.87–5.11)
RDW: 11.9 % (ref 11.5–15.5)
WBC Count: 3.4 10*3/uL — ABNORMAL LOW (ref 4.0–10.5)
nRBC: 0 % (ref 0.0–0.2)

## 2019-04-05 LAB — IRON AND TIBC
Iron: 95 ug/dL (ref 41–142)
Saturation Ratios: 33 % (ref 21–57)
TIBC: 284 ug/dL (ref 236–444)
UIBC: 189 ug/dL (ref 120–384)

## 2019-04-05 LAB — FERRITIN: Ferritin: 18 ng/mL (ref 11–307)

## 2019-04-06 ENCOUNTER — Telehealth: Payer: Self-pay

## 2019-04-06 NOTE — Telephone Encounter (Signed)
VM message left about lab results and follow up lab results to be done in 3 months. Pt informed to continue taking iron daily. Pt informed to call [place] if she has any questions or concerns.

## 2019-04-06 NOTE — Telephone Encounter (Signed)
-----   Message from Gardenia Phlegm, NP sent at 04/06/2019 12:47 PM EDT ----- Please call patient and let her know that her iron is slowly improving.  Recommend she continue to take daily.  I recommend that she have repeat CBC, Ferritin, and iron studies in three months to make sure they continue to rise.    Thanks, Wilber Bihari ----- Message ----- From: Buel Ream, Lab In [place] Sent: 04/05/2019  11:36 AM EDT To: Gardenia Phlegm, NP

## 2019-07-22 ENCOUNTER — Telehealth: Payer: Self-pay | Admitting: Adult Health

## 2019-07-22 NOTE — Telephone Encounter (Signed)
Returned patient's phone call regarding scheduling an appointment, patient informed me nurse instructed patient to get a lab appointment done from 08/17. Lab appointment scheduled for 12/07.

## 2019-07-23 ENCOUNTER — Other Ambulatory Visit: Payer: Self-pay | Admitting: *Deleted

## 2019-07-23 DIAGNOSIS — D508 Other iron deficiency anemias: Secondary | ICD-10-CM

## 2019-07-26 ENCOUNTER — Other Ambulatory Visit: Payer: Self-pay

## 2019-07-26 ENCOUNTER — Inpatient Hospital Stay: Payer: 59 | Attending: Adult Health

## 2019-07-26 DIAGNOSIS — D508 Other iron deficiency anemias: Secondary | ICD-10-CM

## 2019-07-26 DIAGNOSIS — E611 Iron deficiency: Secondary | ICD-10-CM | POA: Diagnosis not present

## 2019-07-26 LAB — IRON AND TIBC
Iron: 88 ug/dL (ref 41–142)
Saturation Ratios: 27 % (ref 21–57)
TIBC: 320 ug/dL (ref 236–444)
UIBC: 232 ug/dL (ref 120–384)

## 2019-07-26 LAB — CBC WITH DIFFERENTIAL (CANCER CENTER ONLY)
Abs Immature Granulocytes: 0 10*3/uL (ref 0.00–0.07)
Basophils Absolute: 0 10*3/uL (ref 0.0–0.1)
Basophils Relative: 1 %
Eosinophils Absolute: 0.1 10*3/uL (ref 0.0–0.5)
Eosinophils Relative: 2 %
HCT: 42.6 % (ref 36.0–46.0)
Hemoglobin: 14.1 g/dL (ref 12.0–15.0)
Immature Granulocytes: 0 %
Lymphocytes Relative: 29 %
Lymphs Abs: 1.1 10*3/uL (ref 0.7–4.0)
MCH: 30.5 pg (ref 26.0–34.0)
MCHC: 33.1 g/dL (ref 30.0–36.0)
MCV: 92 fL (ref 80.0–100.0)
Monocytes Absolute: 0.3 10*3/uL (ref 0.1–1.0)
Monocytes Relative: 8 %
Neutro Abs: 2.3 10*3/uL (ref 1.7–7.7)
Neutrophils Relative %: 60 %
Platelet Count: 203 10*3/uL (ref 150–400)
RBC: 4.63 MIL/uL (ref 3.87–5.11)
RDW: 12 % (ref 11.5–15.5)
WBC Count: 3.8 10*3/uL — ABNORMAL LOW (ref 4.0–10.5)
nRBC: 0 % (ref 0.0–0.2)

## 2019-07-26 LAB — FERRITIN: Ferritin: 21 ng/mL (ref 11–307)

## 2019-08-20 DIAGNOSIS — U071 COVID-19: Secondary | ICD-10-CM

## 2019-08-20 HISTORY — DX: COVID-19: U07.1

## 2020-03-17 ENCOUNTER — Ambulatory Visit: Payer: 59 | Admitting: Nurse Practitioner

## 2020-03-17 ENCOUNTER — Encounter: Payer: Self-pay | Admitting: Nurse Practitioner

## 2020-03-17 ENCOUNTER — Other Ambulatory Visit: Payer: Self-pay

## 2020-03-17 VITALS — BP 110/68 | HR 76 | Temp 97.6°F | Ht 64.0 in | Wt 114.0 lb

## 2020-03-17 DIAGNOSIS — D508 Other iron deficiency anemias: Secondary | ICD-10-CM

## 2020-03-17 DIAGNOSIS — Z1159 Encounter for screening for other viral diseases: Secondary | ICD-10-CM

## 2020-03-17 DIAGNOSIS — Z0000 Encounter for general adult medical examination without abnormal findings: Secondary | ICD-10-CM

## 2020-03-17 NOTE — Progress Notes (Signed)
Established Patient Office Visit  Subjective:  Patient ID: Cassandra Vargas, female    DOB: 10/04/1980  Age: 39 y.o. MRN: [id number]  CC:  Chief Complaint  Patient presents with  . Transitions Of Care    needs labs checked    HPI [name] is a 39 year old who presents to establish care with a primary care provider.  Her main concern is history of iron deficiency anemia followed by hematology.  She was advised to get her iron and ferritin levels checked periodically.   IDA: Diagnosed with IDA in 2017.  Because of her IDA was thought to be from childbirth and her menstrual cycles.She has never been a blood donor  She is no longer having fatigue or shortness of breath on iron supplementation.  She reports her menstrual cycles are actually normal, not heavy, and never last longer than 6 days now.  In fact, bleeding is much is  very light after 3 days.  She has noted no blood or melena stool.   Past Medical History:  Diagnosis Date  . Anxiety   . Medical history non-contributory     Past Surgical History:  Procedure Laterality Date  . NO PAST SURGERIES    . TUBAL LIGATION Bilateral 07/09/2015   Procedure: POST PARTUM TUBAL LIGATION;  Surgeon: Olga Millers, MD;  Location: [facility];  Service: Gynecology;  Laterality: Bilateral;    Family History  Problem Relation Age of Onset  . Crohn's disease Sister   . Crohn's disease Maternal Aunt   . Alcohol abuse Neg Hx   . Colon cancer Neg Hx   . Breast cancer Neg Hx     Social History   Socioeconomic History  . Marital status: Married    Spouse name: Not on file  . Number of children: Not on file  . Years of education: Not on file  . Highest education level: Not on file  Occupational History  . Not on file  Tobacco Use  . Smoking status: Never Smoker  . Smokeless tobacco: Never Used  Substance and Sexual Activity  . Alcohol use: No  . Drug use: No  . Sexual activity: Not on file  Other Topics Concern  . 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   Social Determinants of Health   Financial Resource Strain:   . Difficulty of Paying Living Expenses:   Food Insecurity:   . Worried About Charity fundraiser in the Last Year:   . Arboriculturist in the Last Year:   Transportation Needs:   . Film/video editor (Medical):   Marland Kitchen Lack of Transportation (Non-Medical):   Physical Activity:   . Days of Exercise per Week:   . Minutes of Exercise per Session:   Stress:   . Feeling of Stress :   Social Connections:   . Frequency of Communication with Friends and Family:   . Frequency of Social Gatherings with Friends and Family:   . Attends Religious Services:   . Active Member of Clubs or Organizations:   . Attends Archivist Meetings:   Marland Kitchen Marital Status:   Intimate Partner Violence:   . Fear of Current or Ex-Partner:   . Emotionally Abused:   Marland Kitchen Physically Abused:   . Sexually Abused:     Outpatient Medications Prior to Visit  Medication Sig Dispense Refill  . ferrous sulfate 325 (65 FE) MG tablet  Take 1 tablet by mouth daily.    . nitrofurantoin, macrocrystal-monohydrate, (MACROBID) 100 MG capsule Take 1 capsule (100 mg total) by mouth 2 (two) times daily. 14 capsule 0   No facility-administered medications prior to visit.    No Known Allergies  ROS Review of Systems  Constitutional: Negative for chills and fever.  HENT: Negative.   Eyes: Negative.   Respiratory: Negative for cough and shortness of breath.   Cardiovascular: Negative for chest pain and palpitations.  Gastrointestinal: Negative.   Endocrine: Negative.   Genitourinary: Negative.   Musculoskeletal: Negative.   Skin: Negative.   Allergic/Immunologic: Negative.   Neurological: Negative.   Hematological: Negative.   Psychiatric/Behavioral:       No anxiety or depression.       Objective:    Physical Exam Vitals reviewed.  Constitutional:      Appearance:  Normal appearance. She is normal weight.  HENT:     Head: Normocephalic.  Eyes:     Conjunctiva/sclera: Conjunctivae normal.     Pupils: Pupils are equal, round, and reactive to light.  Cardiovascular:     Rate and Rhythm: Normal rate and regular rhythm.     Pulses: Normal pulses.     Heart sounds: Normal heart sounds.  Pulmonary:     Effort: Pulmonary effort is normal.     Breath sounds: Normal breath sounds.  Abdominal:     General: Abdomen is flat.     Palpations: Abdomen is soft.     Tenderness: There is no abdominal tenderness.  Musculoskeletal:        General: Normal range of motion.     Cervical back: Normal range of motion.  Skin:    General: Skin is warm and dry.  Neurological:     General: No focal deficit present.     Mental Status: She is alert and oriented to person, place, and time.  Psychiatric:        Mood and Affect: Mood normal.        Behavior: Behavior normal.     BP 110/68 (BP Location: Left Arm, Patient Position: Sitting, Cuff Size: Small)   Pulse 76   Temp 97.6 F (36.4 C) (Oral)   Ht 5\' 4"  (1.626 m)   Wt 114 lb (51.7 kg)   SpO2 99%   BMI 19.57 kg/m  Wt Readings from Last 3 Encounters:  03/17/20 114 lb (51.7 kg)  10/29/18 120 lb (54.4 kg)  08/28/18 121 lb 8 oz (55.1 kg)     Health Maintenance Due  Topic Date Due  . Hepatitis C Screening  Never done    There are no preventive care reminders to display for this patient.  Lab Results  Component Value Date   TSH 1.47 01/08/2018   Lab Results  Component Value Date   WBC 3.8 (L) 07/26/2019   HGB 14.1 07/26/2019   HCT 42.6 07/26/2019   MCV 92.0 07/26/2019   PLT 203 07/26/2019   Lab Results  Component Value Date   NA 140 08/10/2018   K 3.9 08/10/2018   CO2 29 08/10/2018   GLUCOSE 87 08/10/2018   BUN 11 08/10/2018   CREATININE 0.71 08/10/2018   BILITOT 0.8 08/10/2018   ALKPHOS 40 08/10/2018   AST 13 08/10/2018   ALT 8 08/10/2018   PROT 7.7 08/10/2018   ALBUMIN 4.5 08/10/2018    CALCIUM 9.9 08/10/2018   ANIONGAP 12 02/28/2016   GFR 120.46 08/10/2018   Lab Results  Component Value Date  CHOL 120 01/08/2018   Lab Results  Component Value Date   HDL 52.70 01/08/2018   Lab Results  Component Value Date   LDLCALC 55 01/08/2018   Lab Results  Component Value Date   TRIG 61.0 01/08/2018   Lab Results  Component Value Date   CHOLHDL 2 01/08/2018   Lab Results  Component Value Date   HGBA1C 5.6 01/08/2018      Assessment & Plan:   Problem List Items Addressed This Visit      Other   Routine general medical examination at a health care facility   Relevant Orders   TSH   Comprehensive metabolic panel   Lipid panel   Iron deficiency anemia - Primary   Relevant Orders   CBC with Differential/Platelet   VITAMIN D 25 Hydroxy (Vit-D Deficiency, Fractures)   B12 and Folate Panel    Other Visit Diagnoses    Encounter for HCV screening test for low risk patient       Relevant Orders   Hepatitis C antibody     No orders of the defined types were placed in this encounter. Please go to the lab today for routine laboratory studies including your iron evaluation.  Continue with your healthy lifestyle.  You seem to be doing a great job!  Continue with annual vision exam, every 43-month dental exam, seatbelt use, sunscreen use, regular exercise.  I do recommend the Covid vaccine.  You can check for  Coca-Cola vaccine at your local pharmacy.  Follow-up: No follow-ups on file.   This visit occurred during the SARS-CoV-2 public health emergency.  Safety protocols were in place, including screening questions prior to the visit, additional usage of staff PPE, and extensive cleaning of exam room while observing appropriate contact time as indicated for disinfecting solutions.   Denice Paradise, NP

## 2020-03-17 NOTE — Patient Instructions (Addendum)
Please go to the lab today for routine laboratory studies including your iron evaluation.  Continue with your healthy lifestyle.  You seem to be doing a great job!  Continue with annual vision exam, every 56-monthdental exam, seatbelt use, sunscreen use, regular exercise.  I do recommend the Covid vaccine.  You can check for  PCoca-Colavaccine at your local pharmacy.   Preventive Care [age]Years Old, Female Preventive care refers to visits with your health care provider and lifestyle choices that can promote health and wellness. This includes:  A yearly physical exam. This may also be called an annual well check.  Regular dental visits and eye exams.  Immunizations.  Screening for certain conditions.  Healthy lifestyle choices, such as eating a healthy diet, getting regular exercise, not using drugs or products that contain nicotine and tobacco, and limiting alcohol use. What can I expect for my preventive care visit? Physical exam Your health care provider will check your:  Height and weight. This may be used to calculate body mass index (BMI), which tells if you are at a healthy weight.  Heart rate and blood pressure.  Skin for abnormal spots. Counseling Your health care provider may ask you questions about your:  Alcohol, tobacco, and drug use.  Emotional well-being.  Home and relationship well-being.  Sexual activity.  Eating habits.  Work and work eStatistician  Method of birth control.  Menstrual cycle.  Pregnancy history. What immunizations do I need?  Influenza (flu) vaccine  This is recommended every year. Tetanus, diphtheria, and pertussis (Tdap) vaccine  You may need a Td booster every 10 years. Varicella (chickenpox) vaccine  You may need this if you have not been vaccinated. Human papillomavirus (HPV) vaccine  If recommended by your health care provider, you may need three doses over 6 months. Measles, mumps, and rubella (MMR) vaccine  You may  need at least one dose of MMR. You may also need a second dose. Meningococcal conjugate (MenACWY) vaccine  One dose is recommended if you are age 41-21 years and a first-year college student living in a residence hall, or if you have one of several medical conditions. You may also need additional booster doses. Pneumococcal conjugate (PCV13) vaccine  You may need this if you have certain conditions and were not previously vaccinated. Pneumococcal polysaccharide (PPSV23) vaccine  You may need one or two doses if you smoke cigarettes or if you have certain conditions. Hepatitis A vaccine  You may need this if you have certain conditions or if you travel or work in places where you may be exposed to hepatitis A. Hepatitis B vaccine  You may need this if you have certain conditions or if you travel or work in places where you may be exposed to hepatitis B. Haemophilus influenzae type b (Hib) vaccine  You may need this if you have certain conditions. You may receive vaccines as individual doses or as more than one vaccine together in one shot (combination vaccines). Talk with your health care provider about the risks and benefits of combination vaccines. What tests do I need?  Blood tests  Lipid and cholesterol levels. These may be checked every 5 years starting at age 37  Hepatitis C test.  Hepatitis B test. Screening  Diabetes screening. This is done by checking your blood sugar (glucose) after you have not eaten for a while (fasting).  Sexually transmitted disease (STD) testing.  BRCA-related cancer screening. This may be done if you have a family history of  breast, ovarian, tubal, or peritoneal cancers.  Pelvic exam and Pap test. This may be done every 3 years starting at age 40. Starting at age 84, this may be done every 5 years if you have a Pap test in combination with an HPV test. Talk with your health care provider about your test results, treatment options, and if  necessary, the need for more tests. Follow these instructions at home: Eating and drinking   Eat a diet that includes fresh fruits and vegetables, whole grains, lean protein, and low-fat dairy.  Take vitamin and mineral supplements as recommended by your health care provider.  Do not drink alcohol if: ? Your health care provider tells you not to drink. ? You are pregnant, may be pregnant, or are planning to become pregnant.  If you drink alcohol: ? Limit how much you have to 0-1 drink a day. ? Be aware of how much alcohol is in your drink. In the [place] one drink equals one 12 oz bottle of beer (355 mL), one 5 oz glass of wine (148 mL), or one 1 oz glass of hard liquor (44 mL). Lifestyle  Take daily care of your teeth and gums.  Stay active. Exercise for at least 30 minutes on 5 or more days each week.  Do not use any products that contain nicotine or tobacco, such as cigarettes, e-cigarettes, and chewing tobacco. If you need help quitting, ask your health care provider.  If you are sexually active, practice safe sex. Use a condom or other form of birth control (contraception) in order to prevent pregnancy and STIs (sexually transmitted infections). If you plan to become pregnant, see your health care provider for a preconception visit. What's next?  Visit your health care provider once a year for a well check visit.  Ask your health care provider how often you should have your eyes and teeth checked.  Stay up to date on all vaccines. This information is not intended to replace advice given to you by your health care provider. Make sure you discuss any questions you have with your health care provider. Document Revised: 04/16/2018 Document Reviewed: 04/16/2018 Elsevier Patient Education  2020 [place] 58-48 Years Old, Female Preventive care refers to visits with your health care provider and lifestyle choices that can promote health and wellness. This  includes:  A yearly physical exam. This may also be called an annual well check.  Regular dental visits and eye exams.  Immunizations.  Screening for certain conditions.  Healthy lifestyle choices, such as eating a healthy diet, getting regular exercise, not using drugs or products that contain nicotine and tobacco, and limiting alcohol use. What can I expect for my preventive care visit? Physical exam Your health care provider will check your:  Height and weight. This may be used to calculate body mass index (BMI), which tells if you are at a healthy weight.  Heart rate and blood pressure.  Skin for abnormal spots. Counseling Your health care provider may ask you questions about your:  Alcohol, tobacco, and drug use.  Emotional well-being.  Home and relationship well-being.  Sexual activity.  Eating habits.  Work and work Statistician.  Method of birth control.  Menstrual cycle.  Pregnancy history. What immunizations do I need?  Influenza (flu) vaccine  This is recommended every year. Tetanus, diphtheria, and pertussis (Tdap) vaccine  You may need a Td booster every 10 years. Varicella (chickenpox) vaccine  You may need this if you have not  been vaccinated. Human papillomavirus (HPV) vaccine  If recommended by your health care provider, you may need three doses over 6 months. Measles, mumps, and rubella (MMR) vaccine  You may need at least one dose of MMR. You may also need a second dose. Meningococcal conjugate (MenACWY) vaccine  One dose is recommended if you are age 15-21 years and a first-year college student living in a residence hall, or if you have one of several medical conditions. You may also need additional booster doses. Pneumococcal conjugate (PCV13) vaccine  You may need this if you have certain conditions and were not previously vaccinated. Pneumococcal polysaccharide (PPSV23) vaccine  You may need one or two doses if you smoke  cigarettes or if you have certain conditions. Hepatitis A vaccine  You may need this if you have certain conditions or if you travel or work in places where you may be exposed to hepatitis A. Hepatitis B vaccine  You may need this if you have certain conditions or if you travel or work in places where you may be exposed to hepatitis B. Haemophilus influenzae type b (Hib) vaccine  You may need this if you have certain conditions. You may receive vaccines as individual doses or as more than one vaccine together in one shot (combination vaccines). Talk with your health care provider about the risks and benefits of combination vaccines. What tests do I need?  Blood tests  Lipid and cholesterol levels. These may be checked every 5 years starting at age 108.  Hepatitis C test.  Hepatitis B test. Screening  Diabetes screening. This is done by checking your blood sugar (glucose) after you have not eaten for a while (fasting).  Sexually transmitted disease (STD) testing.  BRCA-related cancer screening. This may be done if you have a family history of breast, ovarian, tubal, or peritoneal cancers.  Pelvic exam and Pap test. This may be done every 3 years starting at age 68. Starting at age 15, this may be done every 5 years if you have a Pap test in combination with an HPV test. Talk with your health care provider about your test results, treatment options, and if necessary, the need for more tests. Follow these instructions at home: Eating and drinking   Eat a diet that includes fresh fruits and vegetables, whole grains, lean protein, and low-fat dairy.  Take vitamin and mineral supplements as recommended by your health care provider.  Do not drink alcohol if: ? Your health care provider tells you not to drink. ? You are pregnant, may be pregnant, or are planning to become pregnant.  If you drink alcohol: ? Limit how much you have to 0-1 drink a day. ? Be aware of how much alcohol  is in your drink. In the [place] one drink equals one 12 oz bottle of beer (355 mL), one 5 oz glass of wine (148 mL), or one 1 oz glass of hard liquor (44 mL). Lifestyle  Take daily care of your teeth and gums.  Stay active. Exercise for at least 30 minutes on 5 or more days each week.  Do not use any products that contain nicotine or tobacco, such as cigarettes, e-cigarettes, and chewing tobacco. If you need help quitting, ask your health care provider.  If you are sexually active, practice safe sex. Use a condom or other form of birth control (contraception) in order to prevent pregnancy and STIs (sexually transmitted infections). If you plan to become pregnant, see your health care provider for a  preconception visit. What's next?  Visit your health care provider once a year for a well check visit.  Ask your health care provider how often you should have your eyes and teeth checked.  Stay up to date on all vaccines. This information is not intended to replace advice given to you by your health care provider. Make sure you discuss any questions you have with your health care provider. Document Revised: 04/16/2018 Document Reviewed: 04/16/2018 Elsevier Patient Education  2020 [place] you Janett Billow

## 2020-03-18 LAB — IRON AND TIBC
Iron Saturation: 16 % (ref 15–55)
Iron: 46 ug/dL (ref 27–159)
Total Iron Binding Capacity: 281 ug/dL (ref 250–450)
UIBC: 235 ug/dL (ref 131–425)

## 2020-03-20 LAB — CBC WITH DIFFERENTIAL/PLATELET
Absolute Monocytes: 465 cells/uL (ref 200–950)
Basophils Absolute: 30 cells/uL (ref 0–200)
Basophils Relative: 1 %
Eosinophils Absolute: 30 cells/uL (ref 15–500)
Eosinophils Relative: 1 %
HCT: 41.6 % (ref 35.0–45.0)
Hemoglobin: 14.1 g/dL (ref 11.7–15.5)
Lymphs Abs: 1092 cells/uL (ref 850–3900)
MCH: 30.8 pg (ref 27.0–33.0)
MCHC: 33.9 g/dL (ref 32.0–36.0)
MCV: 90.8 fL (ref 80.0–100.0)
MPV: 11.2 fL (ref 7.5–12.5)
Monocytes Relative: 15.5 %
Neutro Abs: 1383 cells/uL — ABNORMAL LOW (ref 1500–7800)
Neutrophils Relative %: 46.1 %
Platelets: 188 10*3/uL (ref 140–400)
RBC: 4.58 10*6/uL (ref 3.80–5.10)
RDW: 11.6 % (ref 11.0–15.0)
Total Lymphocyte: 36.4 %
WBC: 3 10*3/uL — ABNORMAL LOW (ref 3.8–10.8)

## 2020-03-20 LAB — VITAMIN D 25 HYDROXY (VIT D DEFICIENCY, FRACTURES): Vit D, 25-Hydroxy: 22 ng/mL — ABNORMAL LOW (ref 30–100)

## 2020-03-20 LAB — COMPREHENSIVE METABOLIC PANEL
AG Ratio: 1.5 (calc) (ref 1.0–2.5)
ALT: 8 U/L (ref 6–29)
AST: 14 U/L (ref 10–30)
Albumin: 4.4 g/dL (ref 3.6–5.1)
Alkaline phosphatase (APISO): 51 U/L (ref 31–125)
BUN: 10 mg/dL (ref 7–25)
CO2: 24 mmol/L (ref 20–32)
Calcium: 9.7 mg/dL (ref 8.6–10.2)
Chloride: 105 mmol/L (ref 98–110)
Creat: 0.72 mg/dL (ref 0.50–1.10)
Globulin: 3 g/dL (calc) (ref 1.9–3.7)
Glucose, Bld: 121 mg/dL — ABNORMAL HIGH (ref 65–99)
Potassium: 4.2 mmol/L (ref 3.5–5.3)
Sodium: 139 mmol/L (ref 135–146)
Total Bilirubin: 0.7 mg/dL (ref 0.2–1.2)
Total Protein: 7.4 g/dL (ref 6.1–8.1)

## 2020-03-20 LAB — LIPID PANEL
Cholesterol: 124 mg/dL (ref <200)
HDL: 54 mg/dL (ref >=50)
LDL Cholesterol (Calc): 57 mg/dL (calc)
Non-HDL Cholesterol (Calc): 70 mg/dL (calc) (ref <130)
Total CHOL/HDL Ratio: 2.3 (calc) (ref <5.0)
Triglycerides: 53 mg/dL (ref <150)

## 2020-03-20 LAB — B12 AND FOLATE PANEL
Folate: 18.1 ng/mL
Vitamin B-12: 374 pg/mL (ref 200–1100)

## 2020-03-20 LAB — TSH: TSH: 1.15 mIU/L

## 2020-03-20 LAB — HEPATITIS C ANTIBODY
Hepatitis C Ab: NONREACTIVE
SIGNAL TO CUT-OFF: 0.04 (ref <1.00)

## 2020-05-01 ENCOUNTER — Ambulatory Visit
Admission: EM | Admit: 2020-05-01 | Discharge: 2020-05-01 | Disposition: A | Discharge status: Home / Self Care | Payer: 59 | Attending: Emergency Medicine | Admitting: Emergency Medicine

## 2020-05-01 DIAGNOSIS — Z20822 Contact with and (suspected) exposure to covid-19: Secondary | ICD-10-CM | POA: Diagnosis not present

## 2020-05-01 DIAGNOSIS — J069 Acute upper respiratory infection, unspecified: Secondary | ICD-10-CM

## 2020-05-01 NOTE — ED Triage Notes (Signed)
Patient's son tested positive for COVID on 04/26/20. Patient began experiencing cough, bodyaches, and congestion 3 days ago. Requesting testing.

## 2020-05-01 NOTE — Discharge Instructions (Signed)
Your COVID test is pending - it is important to quarantine / isolate at home until your results are back. °If you test positive and would like further evaluation for persistent or worsening symptoms, you may schedule an E-visit or virtual (video) visit throughout the [facility] MyChart app or website. ° °PLEASE NOTE: If you develop severe chest pain or shortness of breath please go to the ER or call [phone] for further evaluation --> DO NOT schedule electronic or virtual visits for this. °Please call our office for further guidance / recommendations as needed. ° °For information about the Covid vaccine, please visit [email or web address] °

## 2020-05-01 NOTE — ED Provider Notes (Signed)
Roderic Palau    CSN: [id number] Arrival date & time: 05/01/20  1043      History   Chief Complaint Chief Complaint  Patient presents with  . Cough  . Nasal Congestion  . Generalized Body Aches    HPI Cassandra Vargas is a 39 y.o. female  Presenting for Covid testing: Exposure: son Date of exposure: cohabitate (postive on 9/8) Any fever, symptoms since exposure: yes - mild cough, rhinorrhea, malaise.  Alleviated with dayquil/nyquil.  No F, CP, SOB, N/V.   Past Medical History:  Diagnosis Date  . Anxiety   . Medical history non-contributory     Patient Active Problem List   Diagnosis Date Noted  . Iron deficiency anemia 08/28/2018  . Menorrhagia with regular cycle 08/28/2018  . Encounter for HCV screening test for low risk patient 01/08/2018  . Anemia 09/29/2017  . Indication for care in labor or delivery 07/09/2015  . Encounter for supervision of normal pregnancy in multigravida 07/09/2015    Past Surgical History:  Procedure Laterality Date  . NO PAST SURGERIES    . TUBAL LIGATION Bilateral 07/09/2015   Procedure: POST PARTUM TUBAL LIGATION;  Surgeon: Olga Millers, MD;  Location: [facility];  Service: Gynecology;  Laterality: Bilateral;    OB History    Gravida  4   Para  4   Term  4   Preterm  0   AB  0   Living  4     SAB  0   TAB  0   Ectopic  0   Multiple  0   Live Births  4            Home Medications    Prior to Admission medications   Medication Sig Start Date End Date Taking? Authorizing Provider  ferrous sulfate 325 (65 FE) MG tablet Take 1 tablet by mouth daily.    [provider]    Family History Family History  Problem Relation Age of Onset  . Crohn's disease Sister   . Crohn's disease Maternal Aunt   . Alcohol abuse Neg Hx   . Colon cancer Neg Hx   . Breast cancer Neg Hx     Social History Social History   Tobacco Use  . Smoking status: Never Smoker  . Smokeless tobacco: Never Used   Substance Use Topics  . Alcohol use: No  . Drug use: No     Allergies   Patient has no known allergies.   Review of Systems As per HPI   Physical Exam Triage Vital Signs ED Triage Vitals  Enc Vitals Group     BP 05/01/20 1117 110/76     Pulse Rate 05/01/20 1117 79     Resp 05/01/20 1117 16     Temp 05/01/20 1117 98 F (36.7 C)     Temp src --      SpO2 05/01/20 1117 96 %     Weight --      Height --      Head Circumference --      Peak Flow --      Pain Score 05/01/20 1116 0     Pain Loc --      Pain Edu? --      Excl. in [place]? --    No data found.  Updated Vital Signs BP 110/76   Pulse 79   Temp 98 F (36.7 C)   Resp 16   SpO2 96%   Visual  Acuity Right Eye Distance:   Left Eye Distance:   Bilateral Distance:    Right Eye Near:   Left Eye Near:    Bilateral Near:     Physical Exam Constitutional:      General: She is not in acute distress.    Appearance: She is not ill-appearing or diaphoretic.  HENT:     Head: Normocephalic and atraumatic.     Mouth/Throat:     Mouth: Mucous membranes are moist.     Pharynx: Oropharynx is clear. No oropharyngeal exudate or posterior oropharyngeal erythema.  Eyes:     General: No scleral icterus.    Conjunctiva/sclera: Conjunctivae normal.     Pupils: Pupils are equal, round, and reactive to light.  Neck:     Comments: Trachea midline, negative JVD Cardiovascular:     Rate and Rhythm: Normal rate and regular rhythm.     Heart sounds: No murmur heard.  No gallop.   Pulmonary:     Effort: Pulmonary effort is normal. No respiratory distress.     Breath sounds: No wheezing, rhonchi or rales.  Musculoskeletal:     Cervical back: Neck supple. No tenderness.  Lymphadenopathy:     Cervical: No cervical adenopathy.  Skin:    Capillary Refill: Capillary refill takes less than 2 seconds.     Coloration: Skin is not jaundiced or pale.     Findings: No rash.  Neurological:     General: No focal deficit present.      Mental Status: She is alert and oriented to person, place, and time.      UC Treatments / Results  Labs (all labs ordered are listed, but only abnormal results are displayed) Labs Reviewed  NOVEL CORONAVIRUS, NAA    EKG   Radiology No results found.  Procedures Procedures (including critical care time)  Medications Ordered in UC Medications - No data to display  Initial Impression / Assessment and Plan / UC Course  I have reviewed the triage vital signs and the nursing notes.  Pertinent labs & imaging results that were available during my care of the patient were reviewed by me and considered in my medical decision making (see chart for details).     Patient afebrile, nontoxic, with SpO2 96%.  Covid PCR pending.  Patient to quarantine until results are back.  We will treat supportively as outlined below.  Return precautions discussed, patient verbalized understanding and is agreeable to plan. Final Clinical Impressions(s) / UC Diagnoses   Final diagnoses:  URI with cough and congestion  Close exposure to COVID-19 virus     Discharge Instructions     Your COVID test is pending - it is important to quarantine / isolate at home until your results are back. If you test positive and would like further evaluation for persistent or worsening symptoms, you may schedule an E-visit or virtual (video) visit throughout the [facility] app or website.  PLEASE NOTE: If you develop severe chest pain or shortness of breath please go to the ER or call 9-1-1 for further evaluation --> DO NOT schedule electronic or virtual visits for this. Please call our office for further guidance / recommendations as needed.  For information about the Covid vaccine, please visit [email or web address]    ED Prescriptions    None     PDMP not reviewed this encounter.   Hall[place], [place], [place] 05/01/20 1131

## 2020-05-03 ENCOUNTER — Telehealth: Payer: Self-pay | Admitting: Nurse Practitioner

## 2020-05-03 LAB — NOVEL CORONAVIRUS, NAA: SARS-CoV-2, NAA: DETECTED — AB

## 2020-05-03 LAB — SARS-COV-2, NAA 2 DAY TAT

## 2020-05-03 NOTE — Telephone Encounter (Signed)
Please call her and offer her a  video visit as she tested for Covid in the emergency department on 05/01/2020. I reviewed her PMH and she does not have high risk for Covid complications. I do not think she will qualify for  MAB infusion.

## 2020-05-03 NOTE — Telephone Encounter (Signed)
Patient scheduled for 05/09/20 at 4:30 pm for VV

## 2020-05-04 ENCOUNTER — Telehealth: Payer: Self-pay | Admitting: Physician Assistant

## 2020-05-04 NOTE — Telephone Encounter (Signed)
Called to Discuss with patient about Covid symptoms and the use of the monoclonal antibody infusion for those with mild to moderate Covid symptoms and at a high risk of hospitalization.     Pt appears to qualify for this infusion due to co-morbid conditions and/or a member of an at-risk group in accordance with the FDA Emergency Use Authorization.    Unable to reach pt, left a voicemail and sent a MyChart message with information about MAB.  Tami Lin Yani [place], PA-C 05/04/2020, 9:00 PM [phone]

## 2020-05-09 ENCOUNTER — Telehealth (INDEPENDENT_AMBULATORY_CARE_PROVIDER_SITE_OTHER): Payer: 59 | Admitting: Nurse Practitioner

## 2020-05-09 ENCOUNTER — Other Ambulatory Visit: Payer: Self-pay

## 2020-05-09 ENCOUNTER — Encounter: Payer: Self-pay | Admitting: Nurse Practitioner

## 2020-05-09 VITALS — Ht 64.0 in | Wt 115.0 lb

## 2020-05-09 DIAGNOSIS — D508 Other iron deficiency anemias: Secondary | ICD-10-CM | POA: Diagnosis not present

## 2020-05-09 DIAGNOSIS — U071 COVID-19: Secondary | ICD-10-CM

## 2020-05-10 ENCOUNTER — Encounter: Payer: Self-pay | Admitting: Nurse Practitioner

## 2020-05-10 DIAGNOSIS — U071 COVID-19: Secondary | ICD-10-CM | POA: Insufficient documentation

## 2020-05-10 NOTE — Progress Notes (Signed)
Virtual Visit via Video Note  This visit type was conducted due to national recommendations for restrictions regarding the COVID-19 pandemic (e.g. social distancing).  This format is felt to be most appropriate for this patient at this time.  All issues noted in this document were discussed and addressed.  No physical exam was performed (except for noted visual exam findings with Video Visits).   I connected with@ on 05/10/20 at  4:30 PM EDT by a video enabled telemedicine application or telephone and verified that I am speaking with the correct person using two identifiers. Location patient: home Location provider: work or home office Persons participating in the virtual visit: patient, provider  I discussed the limitations, risks, security and privacy concerns of performing an evaluation and management service by telephone and the availability of in person appointments. I also discussed with the patient that there may be a patient responsible charge related to this service. The patient expressed understanding and agreed to proceed.   Reason for visit: Follow-up Covid infection.  HPI: This 39 year old tested positive for Covid on 05/01/2020. Her young son had it on  04/26/2020.  Her symptoms were mild cough, rhinorrhea, malaise.  She was treating with DayQuil and NyQuil.  No fever, chest pain ,shortness of breath ,nausea or vomiting. She  does not have high risk medical history for Covid complications.  She did not receive the MAB infusion. Patient reports she is continuing to improve and feeling much better.  Her taste and smell are 50% improved.  She has no cough, to slight little stuffy nose present. The malaise resolved. She has not had the Covid vaccine.   ROS: See pertinent positives and negatives per HPI.  Past Medical History:  Diagnosis Date  . Anxiety   . Medical history non-contributory     Past Surgical History:  Procedure Laterality Date  . NO PAST SURGERIES    . TUBAL LIGATION  Bilateral 07/09/2015   Procedure: POST PARTUM TUBAL LIGATION;  Surgeon: Olga Millers, MD;  Location: [facility];  Service: Gynecology;  Laterality: Bilateral;    Family History  Problem Relation Age of Onset  . Crohn's disease Sister   . Crohn's disease Maternal Aunt   . Alcohol abuse Neg Hx   . Colon cancer Neg Hx   . Breast cancer Neg Hx     SOCIAL HX: Never smoked   Current Outpatient Medications:  .  Cholecalciferol (VITAMIN D3) 20 MCG (800 UNIT) TABS, Take by mouth., Disp: , Rfl:  .  ferrous sulfate 325 (65 FE) MG tablet, Take 1 tablet by mouth daily., Disp: , Rfl:  .  Multiple Vitamins-Minerals (HAIR SKIN AND NAILS FORMULA PO), Take by mouth., Disp: , Rfl:   EXAM:  VITALS per patient if applicable:wt 540 lbs  GENERAL: alert, oriented, appears well and in no acute distress  HEENT: atraumatic, conjunctiva clear, no obvious abnormalities on inspection of external nose and ears  NECK: normal movements of the head and neck  LUNGS: on inspection no signs of respiratory distress, breathing rate appears normal, no obvious gross SOB, gasping or wheezing  CV: no obvious cyanosis  MS: moves all visible extremities without noticeable abnormality  PSYCH/NEURO: pleasant and cooperative, no obvious depression or anxiety, speech and thought processing grossly intact  ASSESSMENT AND PLAN:  Discussed the following assessment and plan:  COVID-19 virus infection  Other iron deficiency anemia  I discussed the assessment and treatment plan with the patient. The patient was provided an opportunity to ask questions  and all were answered. The patient agreed with the plan and demonstrated an understanding of the instructions.   The patient was advised to call back or seek an in-person evaluation if the symptoms worsen or if the condition fails to improve as anticipated.   Denice Paradise, NP Adult Nurse Practitioner [facility] [phone]

## 2020-05-10 NOTE — Patient Instructions (Addendum)
Continue to monitor for resolution of Covid virus signs and symptoms.  You have mild stuffy nose now, and gradually getting your taste and smell back.  Follow-up as needed.  Please get the Covid vaccine in the next few months.   You have been advised by hematology to continue to take iron on a regular basis.

## 2020-06-15 ENCOUNTER — Other Ambulatory Visit: Payer: Self-pay

## 2020-06-20 ENCOUNTER — Encounter: Payer: Self-pay | Admitting: Nurse Practitioner

## 2020-06-20 ENCOUNTER — Other Ambulatory Visit: Payer: Self-pay

## 2020-06-20 ENCOUNTER — Ambulatory Visit: Payer: 59 | Admitting: Nurse Practitioner

## 2020-06-20 VITALS — BP 110/70 | HR 72 | Temp 98.6°F | Ht 64.0 in | Wt 118.0 lb

## 2020-06-20 DIAGNOSIS — Z0000 Encounter for general adult medical examination without abnormal findings: Secondary | ICD-10-CM | POA: Diagnosis not present

## 2020-06-20 DIAGNOSIS — D508 Other iron deficiency anemias: Secondary | ICD-10-CM | POA: Diagnosis not present

## 2020-06-20 DIAGNOSIS — Z23 Encounter for immunization: Secondary | ICD-10-CM | POA: Diagnosis not present

## 2020-06-20 DIAGNOSIS — E559 Vitamin D deficiency, unspecified: Secondary | ICD-10-CM

## 2020-06-20 NOTE — Progress Notes (Signed)
Established Patient Office Visit  Subjective:  Patient ID: Cassandra Vargas, female    DOB: Dec 08, 1980  Age: 39 y.o. MRN: [id number]  CC:  Chief Complaint  Patient presents with  . Follow-up    HPI [name] presents for 56-month follow-up.  She reports she is doing well and recovered fully from Covid.  Normal taste and smell.  She had a mild case.  Iron deficiency anemia: She has been followed by hematology and was last seen 08/2018.  IDA was diagnosed in 2017 thought to be from childbirth and menstrual cycles.  No blood donation. She is taking Vitron C 325 mg daily  and is doing well.  No constipation.  No unusual fatigue or weakness.  No blood or melena noted in the stool.  Menstrual cycles are not heavy.  Vitamin D deficiency: She has been taking vitamin D3 800 IU daily  Patient presents today for complete physical. Immunizations: She declines Covid vaccine.  We discussed rationale for this today.  She had Covid in September.  Tdap is up-to-date. Diet: Healthy Exercise: No formal exercise Colonoscopy: Not indicated Dexa: Not indicated Pap Smear: She is going to make appoint with GYN for her Pap test. Dental: Up-to-date every 59-month  Vision: Needs done Wears seatbelts  Past Medical History:  Diagnosis Date  . Anxiety   . Medical history non-contributory     Past Surgical History:  Procedure Laterality Date  . NO PAST SURGERIES    . TUBAL LIGATION Bilateral 07/09/2015   Procedure: POST PARTUM TUBAL LIGATION;  Surgeon: Olga Millers, MD;  Location: [facility];  Service: Gynecology;  Laterality: Bilateral;    Family History  Problem Relation Age of Onset  . Crohn's disease Sister   . Crohn's disease Maternal Aunt   . Alcohol abuse Neg Hx   . Colon cancer Neg Hx   . Breast cancer Neg Hx     Social History   Socioeconomic History  . Marital status: Married    Spouse name: Not on file  . Number of children: Not on file  . Years of education: Not on file  .  Highest education level: Not on file  Occupational History  . Not on file  Tobacco Use  . Smoking status: Never Smoker  . Smokeless tobacco: Never Used  Substance and Sexual Activity  . Alcohol use: No  . Drug use: No  . Sexual activity: Not on file  Other Topics Concern  . 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   Social Determinants of Health   Financial Resource Strain:   . Difficulty of Paying Living Expenses: Not on file  Food Insecurity:   . Worried About Charity fundraiser in the Last Year: Not on file  . Ran Out of Food in the Last Year: Not on file  Transportation Needs:   . Lack of Transportation (Medical): Not on file  . Lack of Transportation (Non-Medical): Not on file  Physical Activity:   . Days of Exercise per Week: Not on file  . Minutes of Exercise per Session: Not on file  Stress:   . Feeling of Stress : Not on file  Social Connections:   . Frequency of Communication with Friends and Family: Not on file  . Frequency of Social Gatherings with Friends and Family: Not on file  . Attends Religious Services: Not on file  . Active Member of Clubs or Organizations:  Not on file  . Attends Archivist Meetings: Not on file  . Marital Status: Not on file  Intimate Partner Violence:   . Fear of Current or Ex-Partner: Not on file  . Emotionally Abused: Not on file  . Physically Abused: Not on file  . Sexually Abused: Not on file    Outpatient Medications Prior to Visit  Medication Sig Dispense Refill  . Cholecalciferol (VITAMIN D3) 20 MCG (800 UNIT) TABS Take by mouth.    . ferrous sulfate 325 (65 FE) MG tablet Take 1 tablet by mouth daily.    . Multiple Vitamins-Minerals (HAIR SKIN AND NAILS FORMULA PO) Take by mouth.     No facility-administered medications prior to visit.    No Known Allergies  Review of Systems  Constitutional: Negative.   HENT: Negative.   Eyes: Negative.   Respiratory:  Negative.   Cardiovascular: Negative.   Gastrointestinal: Negative.   Endocrine: Negative.   Genitourinary: Negative.   Musculoskeletal: Negative.   Allergic/Immunologic: Negative.   Neurological: Negative.   Hematological: Negative.   Psychiatric/Behavioral: Negative.       Objective:    Physical Exam Vitals reviewed.  Constitutional:      Appearance: She is normal weight.  HENT:     Head: Normocephalic and atraumatic.     Nose:     Comments: Wears mask  Eyes:     Conjunctiva/sclera: Conjunctivae normal.     Pupils: Pupils are equal, round, and reactive to light.  Cardiovascular:     Rate and Rhythm: Normal rate and regular rhythm.     Pulses: Normal pulses.     Heart sounds: Normal heart sounds.  Pulmonary:     Effort: Pulmonary effort is normal.     Breath sounds: Normal breath sounds.  Abdominal:     Palpations: Abdomen is soft.     Tenderness: There is no abdominal tenderness.     Comments: She has umbilical that protrudes- reports lifelong and not a hernia. Non tender and not reducible.   Musculoskeletal:        General: Normal range of motion.     Cervical back: Normal range of motion and neck supple.  Skin:    General: Skin is warm and dry.  Neurological:     General: No focal deficit present.     Mental Status: She is alert and oriented to person, place, and time.  Psychiatric:        Mood and Affect: Mood normal.        Behavior: Behavior normal.     BP 110/70 (BP Location: Left Arm, Patient Position: Sitting, Cuff Size: Normal)   Pulse 72   Temp 98.6 F (37 C) (Oral)   Ht 5\' 4"  (1.626 m)   Wt 118 lb (53.5 kg)   SpO2 99%   BMI 20.25 kg/m  Wt Readings from Last 3 Encounters:  06/20/20 118 lb (53.5 kg)  05/09/20 115 lb (52.2 kg)  03/17/20 114 lb (51.7 kg)   Pulse Readings from Last 3 Encounters:  06/20/20 72  05/01/20 79  03/17/20 76    BP Readings from Last 3 Encounters:  06/20/20 110/70  05/01/20 110/76  03/17/20 110/68    Lab  Results  Component Value Date   CHOL 124 03/17/2020   HDL 54 03/17/2020   LDLCALC 57 03/17/2020   TRIG 53 03/17/2020   CHOLHDL 2.3 03/17/2020      There are no preventive care reminders to display for this patient.  There are no preventive care reminders to display for this patient.  Lab Results  Component Value Date   TSH 1.15 03/17/2020   Lab Results  Component Value Date   WBC 5.5 06/20/2020   HGB 13.7 06/20/2020   HCT 40.3 06/20/2020   MCV 91.1 06/20/2020   PLT 209.0 06/20/2020   Lab Results  Component Value Date   NA 139 03/17/2020   K 4.2 03/17/2020   CO2 24 03/17/2020   GLUCOSE 121 (H) 03/17/2020   BUN 10 03/17/2020   CREATININE 0.72 03/17/2020   BILITOT 0.7 03/17/2020   ALKPHOS 40 08/10/2018   AST 14 03/17/2020   ALT 8 03/17/2020   PROT 7.4 03/17/2020   ALBUMIN 4.5 08/10/2018   CALCIUM 9.7 03/17/2020   ANIONGAP 12 02/28/2016   GFR 120.46 08/10/2018   Lab Results  Component Value Date   CHOL 124 03/17/2020   Lab Results  Component Value Date   HDL 54 03/17/2020   Lab Results  Component Value Date   LDLCALC 57 03/17/2020   Lab Results  Component Value Date   TRIG 53 03/17/2020   Lab Results  Component Value Date   CHOLHDL 2.3 03/17/2020   Lab Results  Component Value Date   HGBA1C 5.3 06/20/2020      Assessment & Plan:   Problem List Items Addressed This Visit      Other   Iron deficiency anemia   Preventative health care    Other Visit Diagnoses    Need for immunization against influenza    -  Primary   Relevant Orders   Flu Vaccine QUAD 36+ mos IM (Completed)   Routine general medical examination at a health care facility       Vitamin D deficiency       Relevant Orders   Vitamin D (25 hydroxy) (Completed)      No orders of the defined types were placed in this encounter.  We will call with lab results when completed.  Dentist ever 6 mos. Eye exam annually Flu shot today.   Follow-up: Return in about 1 year (around  06/20/2021).  This visit occurred during the SARS-CoV-2 public health emergency.  Safety protocols were in place, including screening questions prior to the visit, additional usage of staff PPE, and extensive cleaning of exam room while observing appropriate contact time as indicated for disinfecting solutions.     Denice Paradise, NP

## 2020-06-20 NOTE — Patient Instructions (Addendum)
We will call with lab results when completed.  Dentist ever 6 mos. Eye exam annually Flu shot today.   Preventive Care 31-37 Years Old, Female Preventive care refers to visits with your health care provider and lifestyle choices that can promote health and wellness. This includes:  A yearly physical exam. This may also be called an annual well check.  Regular dental visits and eye exams.  Immunizations.  Screening for certain conditions.  Healthy lifestyle choices, such as eating a healthy diet, getting regular exercise, not using drugs or products that contain nicotine and tobacco, and limiting alcohol use. What can I expect for my preventive care visit? Physical exam Your health care provider will check your:  Height and weight. This may be used to calculate body mass index (BMI), which tells if you are at a healthy weight.  Heart rate and blood pressure.  Skin for abnormal spots. Counseling Your health care provider may ask you questions about your:  Alcohol, tobacco, and drug use.  Emotional well-being.  Home and relationship well-being.  Sexual activity.  Eating habits.  Work and work Statistician.  Method of birth control.  Menstrual cycle.  Pregnancy history. What immunizations do I need?  Influenza (flu) vaccine  This is recommended every year. Tetanus, diphtheria, and pertussis (Tdap) vaccine  You may need a Td booster every 10 years. Varicella (chickenpox) vaccine  You may need this if you have not been vaccinated. Human papillomavirus (HPV) vaccine  If recommended by your health care provider, you may need three doses over 6 months. Measles, mumps, and rubella (MMR) vaccine  You may need at least one dose of MMR. You may also need a second dose. Meningococcal conjugate (MenACWY) vaccine  One dose is recommended if you are age 83-21 years and a first-year college student living in a residence hall, or if you have one of several medical  conditions. You may also need additional booster doses. Pneumococcal conjugate (PCV13) vaccine  You may need this if you have certain conditions and were not previously vaccinated. Pneumococcal polysaccharide (PPSV23) vaccine  You may need one or two doses if you smoke cigarettes or if you have certain conditions. Hepatitis A vaccine  You may need this if you have certain conditions or if you travel or work in places where you may be exposed to hepatitis A. Hepatitis B vaccine  You may need this if you have certain conditions or if you travel or work in places where you may be exposed to hepatitis B. Haemophilus influenzae type b (Hib) vaccine  You may need this if you have certain conditions. You may receive vaccines as individual doses or as more than one vaccine together in one shot (combination vaccines). Talk with your health care provider about the risks and benefits of combination vaccines. What tests do I need?  Blood tests  Lipid and cholesterol levels. These may be checked every 5 years starting at age 36.  Hepatitis C test.  Hepatitis B test. Screening  Diabetes screening. This is done by checking your blood sugar (glucose) after you have not eaten for a while (fasting).  Sexually transmitted disease (STD) testing.  BRCA-related cancer screening. This may be done if you have a family history of breast, ovarian, tubal, or peritoneal cancers.  Pelvic exam and Pap test. This may be done every 3 years starting at age 53. Starting at age 82, this may be done every 5 years if you have a Pap test in combination with an HPV  test. Talk with your health care provider about your test results, treatment options, and if necessary, the need for more tests. Follow these instructions at home: Eating and drinking   Eat a diet that includes fresh fruits and vegetables, whole grains, lean protein, and low-fat dairy.  Take vitamin and mineral supplements as recommended by your health  care provider.  Do not drink alcohol if: ? Your health care provider tells you not to drink. ? You are pregnant, may be pregnant, or are planning to become pregnant.  If you drink alcohol: ? Limit how much you have to 0-1 drink a day. ? Be aware of how much alcohol is in your drink. In the [place] one drink equals one 12 oz bottle of beer (355 mL), one 5 oz glass of wine (148 mL), or one 1 oz glass of hard liquor (44 mL). Lifestyle  Take daily care of your teeth and gums.  Stay active. Exercise for at least 30 minutes on 5 or more days each week.  Do not use any products that contain nicotine or tobacco, such as cigarettes, e-cigarettes, and chewing tobacco. If you need help quitting, ask your health care provider.  If you are sexually active, practice safe sex. Use a condom or other form of birth control (contraception) in order to prevent pregnancy and STIs (sexually transmitted infections). If you plan to become pregnant, see your health care provider for a preconception visit. What's next?  Visit your health care provider once a year for a well check visit.  Ask your health care provider how often you should have your eyes and teeth checked.  Stay up to date on all vaccines. This information is not intended to replace advice given to you by your health care provider. Make sure you discuss any questions you have with your health care provider. Document Revised: 04/16/2018 Document Reviewed: 04/16/2018 Elsevier Patient Education  2020 [employer].

## 2020-06-21 LAB — CBC WITH DIFFERENTIAL/PLATELET
Basophils Absolute: 0.1 10*3/uL (ref 0.0–0.1)
Basophils Relative: 1 % (ref 0.0–3.0)
Eosinophils Absolute: 0.2 10*3/uL (ref 0.0–0.7)
Eosinophils Relative: 3.4 % (ref 0.0–5.0)
HCT: 40.3 % (ref 36.0–46.0)
Hemoglobin: 13.7 g/dL (ref 12.0–15.0)
Lymphocytes Relative: 26.9 % (ref 12.0–46.0)
Lymphs Abs: 1.5 10*3/uL (ref 0.7–4.0)
MCHC: 33.9 g/dL (ref 30.0–36.0)
MCV: 91.1 fl (ref 78.0–100.0)
Monocytes Absolute: 0.4 10*3/uL (ref 0.1–1.0)
Monocytes Relative: 8.1 % (ref 3.0–12.0)
Neutro Abs: 3.3 10*3/uL (ref 1.4–7.7)
Neutrophils Relative %: 60.6 % (ref 43.0–77.0)
Platelets: 209 10*3/uL (ref 150.0–400.0)
RBC: 4.43 Mil/uL (ref 3.87–5.11)
RDW: 13 % (ref 11.5–15.5)
WBC: 5.5 10*3/uL (ref 4.0–10.5)

## 2020-06-21 LAB — IBC PANEL
Iron: 60 ug/dL (ref 42–145)
Saturation Ratios: 19 % — ABNORMAL LOW (ref 20.0–50.0)
Transferrin: 225 mg/dL (ref 212.0–360.0)

## 2020-06-21 LAB — VITAMIN D 25 HYDROXY (VIT D DEFICIENCY, FRACTURES): VITD: 27.42 ng/mL — ABNORMAL LOW (ref 30.00–100.00)

## 2020-06-21 LAB — HEMOGLOBIN A1C: Hgb A1c MFr Bld: 5.3 % (ref 4.6–6.5)

## 2020-08-09 ENCOUNTER — Other Ambulatory Visit: Payer: Self-pay | Admitting: Obstetrics & Gynecology

## 2020-08-09 DIAGNOSIS — N920 Excessive and frequent menstruation with regular cycle: Secondary | ICD-10-CM

## 2020-08-29 ENCOUNTER — Ambulatory Visit
Admission: RE | Admit: 2020-08-29 | Discharge: 2020-08-29 | Disposition: A | Discharge status: Home / Self Care | Payer: 59 | Source: Ambulatory Visit | Attending: Obstetrics & Gynecology | Admitting: Obstetrics & Gynecology

## 2020-08-29 DIAGNOSIS — N920 Excessive and frequent menstruation with regular cycle: Secondary | ICD-10-CM

## 2020-09-01 ENCOUNTER — Other Ambulatory Visit: Payer: Self-pay | Admitting: Obstetrics & Gynecology

## 2020-09-01 DIAGNOSIS — N83299 Other ovarian cyst, unspecified side: Secondary | ICD-10-CM

## 2020-09-20 ENCOUNTER — Other Ambulatory Visit (INDEPENDENT_AMBULATORY_CARE_PROVIDER_SITE_OTHER): Payer: 59

## 2020-09-20 ENCOUNTER — Other Ambulatory Visit: Payer: Self-pay

## 2020-09-20 DIAGNOSIS — D508 Other iron deficiency anemias: Secondary | ICD-10-CM | POA: Diagnosis not present

## 2020-09-21 LAB — CBC WITH DIFFERENTIAL/PLATELET
Basophils Absolute: 0.1 10*3/uL (ref 0.0–0.1)
Basophils Relative: 1.1 % (ref 0.0–3.0)
Eosinophils Absolute: 0.2 10*3/uL (ref 0.0–0.7)
Eosinophils Relative: 4 % (ref 0.0–5.0)
HCT: 39.9 % (ref 36.0–46.0)
Hemoglobin: 13.6 g/dL (ref 12.0–15.0)
Lymphocytes Relative: 29.8 % (ref 12.0–46.0)
Lymphs Abs: 1.4 10*3/uL (ref 0.7–4.0)
MCHC: 34.1 g/dL (ref 30.0–36.0)
MCV: 91.2 fl (ref 78.0–100.0)
Monocytes Absolute: 0.4 10*3/uL (ref 0.1–1.0)
Monocytes Relative: 9.4 % (ref 3.0–12.0)
Neutro Abs: 2.6 10*3/uL (ref 1.4–7.7)
Neutrophils Relative %: 55.7 % (ref 43.0–77.0)
Platelets: 201 10*3/uL (ref 150.0–400.0)
RBC: 4.38 Mil/uL (ref 3.87–5.11)
RDW: 12.3 % (ref 11.5–15.5)
WBC: 4.6 10*3/uL (ref 4.0–10.5)

## 2020-09-21 LAB — IBC PANEL
Iron: 107 ug/dL (ref 42–145)
Saturation Ratios: 35.7 % (ref 20.0–50.0)
Transferrin: 214 mg/dL (ref 212.0–360.0)

## 2020-10-30 ENCOUNTER — Ambulatory Visit
Admission: RE | Admit: 2020-10-30 | Discharge: 2020-10-30 | Disposition: A | Discharge status: Home / Self Care | Payer: 59 | Source: Ambulatory Visit | Attending: Obstetrics & Gynecology | Admitting: Obstetrics & Gynecology

## 2020-10-30 DIAGNOSIS — N83299 Other ovarian cyst, unspecified side: Secondary | ICD-10-CM

## 2020-12-13 ENCOUNTER — Other Ambulatory Visit: Payer: Self-pay

## 2020-12-13 ENCOUNTER — Encounter (HOSPITAL_BASED_OUTPATIENT_CLINIC_OR_DEPARTMENT_OTHER): Payer: Self-pay | Admitting: Obstetrics & Gynecology

## 2020-12-13 NOTE — Progress Notes (Signed)
Spoke w/ via phone for pre-op interview---pt Lab needs dos----  Cbc T & s, urine preg             Lab results------none COVID test ------12-15-2020 915 Arrive at -------1000 am 12-18-2020 NPO after MN NO Solid Food.  Clear liquids from MN until---900 am then npo Med rec completed Medications to take morning of surgery -----noine Driver/caregiver     for 24 hours after surgery  Spouse alton mccray will stay Patient Special Instructions -----none Pre-Op special Istructions -----none Patient verbalized understanding of instructions that were given at this phone interview. Patient denies shortness of breath, chest pain, fever, cough at this phone interview.

## 2020-12-15 ENCOUNTER — Other Ambulatory Visit (HOSPITAL_COMMUNITY)
Admission: RE | Admit: 2020-12-15 | Discharge: 2020-12-15 | Disposition: A | Discharge status: Home / Self Care | Payer: 59 | Source: Ambulatory Visit | Attending: Obstetrics & Gynecology | Admitting: Obstetrics & Gynecology

## 2020-12-15 DIAGNOSIS — Z20822 Contact with and (suspected) exposure to covid-19: Secondary | ICD-10-CM | POA: Diagnosis not present

## 2020-12-15 DIAGNOSIS — Z01812 Encounter for preprocedural laboratory examination: Secondary | ICD-10-CM | POA: Insufficient documentation

## 2020-12-15 LAB — SARS CORONAVIRUS 2 (TAT 6-24 HRS): SARS Coronavirus 2: NEGATIVE

## 2020-12-17 ENCOUNTER — Encounter (HOSPITAL_BASED_OUTPATIENT_CLINIC_OR_DEPARTMENT_OTHER): Payer: Self-pay | Admitting: Obstetrics & Gynecology

## 2020-12-17 NOTE — H&P (Signed)
Cassandra Vargas is an 40 y.o. female. Preop visit with me at [facility] 11/27/2020 39yo G4P4 here for preop for ovarian cystectomy 12/18/2020 for left ovarian mass c/w dermoid.  Presents with husband. Patient has history of BTL, husband asking if that is cause of her AUB and re: reversal because they want to consider having more children.   Patient denies any abdominal pain, pelvic discomfort. 08/29/2020 [place] originally done due to prolonged cycles, which she reports she is still having. Incidental finding of left and right ovarian mass.  Patient reports she would like to consider medical management with COCs after surgery if bleeding does not improve.  08/29/2020 [place] with [place] imaging: normal uterus. left ovarian mass 7.9cm c/w dermoid. right ovary 3.1 complex cyst 10/30/2020 f/u US showed normal uterus. right ovary normal. Left ovarian mass 7.6x5.9x6.5cm c/w dermoid.     Past Medical History:  Diagnosis Date  . Anemia   . COVID 2021   LOSS OF TASTE AND SMELL STUFFY NOSE X 2 WEEKS ALL SYMPTOMS RESOLVED   . Ovarian cyst   . Wears glasses     Past Surgical History:  Procedure Laterality Date  . TUBAL LIGATION Bilateral 07/09/2015   Procedure: POST PARTUM TUBAL LIGATION;  Surgeon: Olga Millers, MD;  Location: [facility];  Service: Gynecology;  Laterality: Bilateral;    Family History  Problem Relation Age of Onset  . Crohn's disease Sister   . Crohn's disease Maternal Aunt   . Alcohol abuse Neg Hx   . Colon cancer Neg Hx   . Breast cancer Neg Hx     Social History:  reports that she has never smoked. She has never used smokeless tobacco. She reports that she does not drink alcohol and does not use drugs.  Allergies: No Known Allergies  No medications prior to admission.    Review of Systems per HPI  Height 5\' 4"  (1.626 m), weight 50.8 kg, last menstrual period 11/26/2020, unknown if currently breastfeeding. Physical Exam Exam 11/27/2020 Chaperone Chaperone:  present  Constitutional *General Appearance: healthy-appearing  Head Head: normocephalic  Cardiovascular *Auscultation: RRR, no murmur  Lungs *Respiratory Effort: no accessory muscle usage *Auscultation: clear to auscultation  Abdomen *Inspection/Palpation/Auscultation: non-distended, soft  Female Genitalia Vulva: no masses, no atrophy, no lesions Mons: normal Labia Majora: normal Labia Minora: normal Introitus: normal *Vagina: normal, no discharge *Cervix: grossly normal, no lesions, no discharge *Uterus: normal size, normal contour, midline, mobile, non-tender *Adnexa/Parametria: no tenderness (adnexa mass filling the posterior cul de sac, mobile)  Assessment/Plan: 39yo here for scheduled laparoscopic ovarian cystectomy, possible oophorectomy 2/2 left ovarian dermoid, and hysteroscopy, D&C, polypectomy given AUB (prolonged cycles)  The nature of the procedure was discussed with the patient and her husband in detail, using diagrams as needed. An informed discussion was held regarding the risks and benefits of surgical intervention. Specifically, the patient was apprised of risks of pain, bleeding requiring blood transfusion, infection requiring antibiotics, injury to nearby organs (bowel, bladder, nerves, blood vessels, uterus, ovary). She was informed of the low but real risk of these complications, and understands that the alternative is no surgery. An opportunity to ask questions was provided, and all questions were answered to the patient's satisfaction. Patient expresses understanding of these issues, and agrees to proceed with the plan outlined above. The expected post-operative recovery course was discussed with the patient, and post-operative instructions were reviewed. -Postop visit 01/01/2021  Jhett Fretwell K Taam-Akelman 12/17/2020, 6:11 PM

## 2020-12-18 ENCOUNTER — Encounter (HOSPITAL_BASED_OUTPATIENT_CLINIC_OR_DEPARTMENT_OTHER)
Admission: RE | Disposition: A | Discharge status: Home / Self Care | Payer: Self-pay | Source: Home / Self Care | Attending: Obstetrics & Gynecology

## 2020-12-18 ENCOUNTER — Ambulatory Visit (HOSPITAL_BASED_OUTPATIENT_CLINIC_OR_DEPARTMENT_OTHER): Payer: 59 | Admitting: Anesthesiology

## 2020-12-18 ENCOUNTER — Encounter (HOSPITAL_BASED_OUTPATIENT_CLINIC_OR_DEPARTMENT_OTHER): Payer: Self-pay | Admitting: Obstetrics & Gynecology

## 2020-12-18 ENCOUNTER — Ambulatory Visit (HOSPITAL_BASED_OUTPATIENT_CLINIC_OR_DEPARTMENT_OTHER)
Admission: RE | Admit: 2020-12-18 | Discharge: 2020-12-18 | Disposition: A | Discharge status: Home / Self Care | Payer: 59 | Attending: Obstetrics & Gynecology | Admitting: Obstetrics & Gynecology

## 2020-12-18 ENCOUNTER — Encounter (HOSPITAL_COMMUNITY): Payer: Self-pay | Admitting: Obstetrics & Gynecology

## 2020-12-18 DIAGNOSIS — N939 Abnormal uterine and vaginal bleeding, unspecified: Secondary | ICD-10-CM | POA: Insufficient documentation

## 2020-12-18 DIAGNOSIS — Z8379 Family history of other diseases of the digestive system: Secondary | ICD-10-CM | POA: Insufficient documentation

## 2020-12-18 DIAGNOSIS — Z8616 Personal history of COVID-19: Secondary | ICD-10-CM | POA: Insufficient documentation

## 2020-12-18 DIAGNOSIS — D271 Benign neoplasm of left ovary: Secondary | ICD-10-CM | POA: Insufficient documentation

## 2020-12-18 DIAGNOSIS — N83209 Unspecified ovarian cyst, unspecified side: Secondary | ICD-10-CM | POA: Diagnosis present

## 2020-12-18 HISTORY — DX: Unspecified ovarian cyst, unspecified side: N83.209

## 2020-12-18 HISTORY — PX: HYSTEROSCOPY: SHX211

## 2020-12-18 HISTORY — PX: LAPAROSCOPIC BILATERAL SALPINGECTOMY: SHX5889

## 2020-12-18 HISTORY — DX: Presence of spectacles and contact lenses: Z97.3

## 2020-12-18 HISTORY — DX: Anemia, unspecified: D64.9

## 2020-12-18 LAB — CBC
HCT: 41.5 % (ref 36.0–46.0)
Hemoglobin: 13.7 g/dL (ref 12.0–15.0)
MCH: 31.1 pg (ref 26.0–34.0)
MCHC: 33 g/dL (ref 30.0–36.0)
MCV: 94.1 fL (ref 80.0–100.0)
Platelets: 197 10*3/uL (ref 150–400)
RBC: 4.41 MIL/uL (ref 3.87–5.11)
RDW: 11.9 % (ref 11.5–15.5)
WBC: 3.2 10*3/uL — ABNORMAL LOW (ref 4.0–10.5)
nRBC: 0 % (ref 0.0–0.2)

## 2020-12-18 LAB — POCT PREGNANCY, URINE: Preg Test, Ur: NEGATIVE

## 2020-12-18 LAB — TYPE AND SCREEN
ABO/RH(D): B POS
Antibody Screen: NEGATIVE

## 2020-12-18 SURGERY — SALPINGECTOMY, BILATERAL, LAPAROSCOPIC
Anesthesia: General | Site: Vagina

## 2020-12-18 MED ORDER — OXYCODONE-ACETAMINOPHEN 5-325 MG PO TABS: 1.0000 | ORAL_TABLET | Freq: Four times a day (QID) | ORAL | 0 refills | Status: DC | PRN

## 2020-12-18 MED ORDER — ROCURONIUM BROMIDE 100 MG/10ML IV SOLN
INTRAVENOUS | Status: DC | PRN
  Administered 2020-12-18: 60 mg via INTRAVENOUS
  Administered 2020-12-18: 10 mg via INTRAVENOUS

## 2020-12-18 MED ORDER — ACETAMINOPHEN 500 MG PO TABS
ORAL_TABLET | ORAL | Status: AC
  Filled 2020-12-18: qty 2

## 2020-12-18 MED ORDER — KETOROLAC TROMETHAMINE 30 MG/ML IJ SOLN
INTRAMUSCULAR | Status: AC
  Filled 2020-12-18: qty 1

## 2020-12-18 MED ORDER — FENTANYL CITRATE (PF) 100 MCG/2ML IJ SOLN
INTRAMUSCULAR | Status: DC | PRN
  Administered 2020-12-18: 50 ug via INTRAVENOUS
  Administered 2020-12-18: 100 ug via INTRAVENOUS
  Administered 2020-12-18: 50 ug via INTRAVENOUS

## 2020-12-18 MED ORDER — KETOROLAC TROMETHAMINE 15 MG/ML IJ SOLN: 30.0000 mg | INTRAMUSCULAR | Status: DC

## 2020-12-18 MED ORDER — IBUPROFEN 600 MG PO TABS: 600.0000 mg | ORAL_TABLET | Freq: Four times a day (QID) | ORAL | 0 refills | Status: DC | PRN

## 2020-12-18 MED ORDER — ONDANSETRON HCL 4 MG/2ML IJ SOLN
INTRAMUSCULAR | Status: DC | PRN
  Administered 2020-12-18: 4 mg via INTRAVENOUS

## 2020-12-18 MED ORDER — ONDANSETRON HCL 4 MG/2ML IJ SOLN
INTRAMUSCULAR | Status: AC
  Filled 2020-12-18: qty 2

## 2020-12-18 MED ORDER — GABAPENTIN 300 MG PO CAPS
ORAL_CAPSULE | ORAL | Status: AC
  Filled 2020-12-18: qty 1

## 2020-12-18 MED ORDER — SCOPOLAMINE 1 MG/3DAYS TD PT72
1.0000 | MEDICATED_PATCH | TRANSDERMAL | Status: DC
  Administered 2020-12-18: 1.5 mg via TRANSDERMAL

## 2020-12-18 MED ORDER — ACETAMINOPHEN 500 MG PO TABS
1000.0000 mg | ORAL_TABLET | ORAL | Status: AC
  Administered 2020-12-18: 1000 mg via ORAL

## 2020-12-18 MED ORDER — PROPOFOL 10 MG/ML IV BOLUS
INTRAVENOUS | Status: DC | PRN
  Administered 2020-12-18: 150 mg via INTRAVENOUS

## 2020-12-18 MED ORDER — MIDAZOLAM HCL 2 MG/2ML IJ SOLN
INTRAMUSCULAR | Status: AC
  Filled 2020-12-18: qty 2

## 2020-12-18 MED ORDER — FENTANYL CITRATE (PF) 100 MCG/2ML IJ SOLN
INTRAMUSCULAR | Status: AC
  Filled 2020-12-18: qty 2

## 2020-12-18 MED ORDER — KETOROLAC TROMETHAMINE 30 MG/ML IJ SOLN
INTRAMUSCULAR | Status: DC | PRN
  Administered 2020-12-18: 30 mg via INTRAVENOUS

## 2020-12-18 MED ORDER — SCOPOLAMINE 1 MG/3DAYS TD PT72
MEDICATED_PATCH | TRANSDERMAL | Status: AC
  Filled 2020-12-18: qty 1

## 2020-12-18 MED ORDER — DEXAMETHASONE SODIUM PHOSPHATE 4 MG/ML IJ SOLN
INTRAMUSCULAR | Status: DC | PRN
  Administered 2020-12-18: 10 mg via INTRAVENOUS

## 2020-12-18 MED ORDER — PROPOFOL 10 MG/ML IV BOLUS
INTRAVENOUS | Status: AC
  Filled 2020-12-18: qty 20

## 2020-12-18 MED ORDER — SUGAMMADEX SODIUM 200 MG/2ML IV SOLN
INTRAVENOUS | Status: DC | PRN
  Administered 2020-12-18: 200 mg via INTRAVENOUS

## 2020-12-18 MED ORDER — BUPIVACAINE HCL (PF) 0.25 % IJ SOLN
INTRAMUSCULAR | Status: DC | PRN
  Administered 2020-12-18: 5 mL

## 2020-12-18 MED ORDER — SODIUM CHLORIDE 0.9 % IR SOLN
Status: DC | PRN
  Administered 2020-12-18: 3000 mL
  Administered 2020-12-18: 1000 mL

## 2020-12-18 MED ORDER — ENSURE PRE-SURGERY PO LIQD: 296.0000 mL | Freq: Once | ORAL | Status: DC

## 2020-12-18 MED ORDER — MIDAZOLAM HCL 5 MG/5ML IJ SOLN
INTRAMUSCULAR | Status: DC | PRN
  Administered 2020-12-18: 2 mg via INTRAVENOUS

## 2020-12-18 MED ORDER — ROCURONIUM BROMIDE 10 MG/ML (PF) SYRINGE
PREFILLED_SYRINGE | INTRAVENOUS | Status: AC
  Filled 2020-12-18: qty 10

## 2020-12-18 MED ORDER — GABAPENTIN 300 MG PO CAPS
300.0000 mg | ORAL_CAPSULE | ORAL | Status: AC
  Administered 2020-12-18: 300 mg via ORAL

## 2020-12-18 MED ORDER — DEXAMETHASONE SODIUM PHOSPHATE 10 MG/ML IJ SOLN
INTRAMUSCULAR | Status: AC
  Filled 2020-12-18: qty 1

## 2020-12-18 MED ORDER — LIDOCAINE HCL (CARDIAC) PF 100 MG/5ML IV SOSY
PREFILLED_SYRINGE | INTRAVENOUS | Status: DC | PRN
  Administered 2020-12-18: 100 mg via INTRAVENOUS

## 2020-12-18 MED ORDER — LACTATED RINGERS IV SOLN: INTRAVENOUS | Status: DC

## 2020-12-18 SURGICAL SUPPLY — 42 items
ADH SKN CLS APL DERMABOND .7 (GAUZE/BANDAGES/DRESSINGS) ×3
AGENT HMST KT MTR STRL THRMB (HEMOSTASIS) ×3
APL ESCP 34 STRL LF DISP (HEMOSTASIS) ×3
APPLICATOR SURGIFLO ENDO (HEMOSTASIS) ×2 IMPLANT
BAG SPEC RTRVL LRG 6X4 10 (ENDOMECHANICALS) ×3
CATH ROBINSON RED A/P 16FR (CATHETERS) ×4 IMPLANT
COVER MAYO STAND STRL (DRAPES) ×4 IMPLANT
DERMABOND ADVANCED (GAUZE/BANDAGES/DRESSINGS) ×1
DERMABOND ADVANCED .7 DNX12 (GAUZE/BANDAGES/DRESSINGS) ×3 IMPLANT
DRSG OPSITE POSTOP 3X4 (GAUZE/BANDAGES/DRESSINGS) ×2 IMPLANT
GAUZE 4X4 16PLY RFD (DISPOSABLE) ×4 IMPLANT
GLOVE SURG ENC MOIS LTX SZ6 (GLOVE) ×2 IMPLANT
GLOVE SURG LTX SZ6 (GLOVE) ×4 IMPLANT
GLOVE SURG UNDER POLY LF SZ6 (GLOVE) ×8 IMPLANT
GLOVE SURG UNDER POLY LF SZ6.5 (GLOVE) ×2 IMPLANT
GLOVE SURG UNDER POLY LF SZ7 (GLOVE) ×10 IMPLANT
GOWN STRL REUS W/TWL LRG LVL3 (GOWN DISPOSABLE) ×10 IMPLANT
IV NS 1000ML (IV SOLUTION) ×4
IV NS 1000ML BAXH (IV SOLUTION) ×1 IMPLANT
IV NS IRRIG 3000ML ARTHROMATIC (IV SOLUTION) ×4 IMPLANT
KIT PROCEDURE FLUENT (KITS) ×4 IMPLANT
KIT TURNOVER CYSTO (KITS) ×4 IMPLANT
MANIFOLD NEPTUNE II (INSTRUMENTS) ×2 IMPLANT
NS IRRIG 1000ML POUR BTL (IV SOLUTION) ×4 IMPLANT
PACK LAPAROSCOPY BASIN (CUSTOM PROCEDURE TRAY) ×4 IMPLANT
PACK TRENDGUARD 450 HYBRID PRO (MISCELLANEOUS) ×1 IMPLANT
PACK VAGINAL MINOR WOMEN LF (CUSTOM PROCEDURE TRAY) ×4 IMPLANT
PAD OB MATERNITY 4.3X12.25 (PERSONAL CARE ITEMS) ×4 IMPLANT
POUCH SPECIMEN RETRIEVAL 10MM (ENDOMECHANICALS) ×2 IMPLANT
SEAL ROD LENS SCOPE MYOSURE (ABLATOR) ×4 IMPLANT
SET SUCTION IRRIG HYDROSURG (IRRIGATION / IRRIGATOR) ×2 IMPLANT
SET TUBE SMOKE EVAC HIGH FLOW (TUBING) ×4 IMPLANT
SURGIFLO W/THROMBIN 8M KIT (HEMOSTASIS) ×2 IMPLANT
SUT VICRYL 0 UR6 27IN ABS (SUTURE) ×4 IMPLANT
SUT VICRYL RAPIDE 3 0 (SUTURE) ×6 IMPLANT
TOWEL OR 17X26 10 PK STRL BLUE (TOWEL DISPOSABLE) ×4 IMPLANT
TRAP SPECIMEN MUCUS 40CC (MISCELLANEOUS) ×2 IMPLANT
TRAY FOLEY W/BAG SLVR 14FR LF (SET/KITS/TRAYS/PACK) ×4 IMPLANT
TRENDGUARD 450 HYBRID PRO PACK (MISCELLANEOUS) ×4
TROCAR BLADELESS OPT 5 100 (ENDOMECHANICALS) ×8 IMPLANT
TROCAR XCEL NON-BLD 11X100MML (ENDOMECHANICALS) ×4 IMPLANT
WARMER LAPAROSCOPE (MISCELLANEOUS) ×4 IMPLANT

## 2020-12-18 NOTE — Anesthesia Preprocedure Evaluation (Addendum)
Anesthesia Evaluation  Patient identified by MRN, date of birth, ID band Patient awake    Reviewed: Allergy & Precautions, NPO status , Patient's Chart, lab work & pertinent test results  History of Anesthesia Complications Negative for: history of anesthetic complications  Airway Mallampati: I  TM Distance: >3 FB Neck ROM: Full    Dental  (+) Teeth Intact, Dental Advisory Given   Pulmonary    breath sounds clear to auscultation- rhonchi       Cardiovascular  Rhythm:Regular     Neuro/Psych    GI/Hepatic   Endo/Other    Renal/GU      Musculoskeletal   Abdominal   Peds  Hematology  (+) Blood dyscrasia, anemia ,   Anesthesia Other Findings   Reproductive/Obstetrics                            Anesthesia Physical  Anesthesia Plan  ASA: I  Anesthesia Plan: General   Post-op Pain Management:    Induction: Intravenous  PONV Risk Score and Plan: 3 and Ondansetron, Dexamethasone and Midazolam  Airway Management Planned: Oral ETT  Additional Equipment: None  Intra-op Plan:   Post-operative Plan: Extubation in OR  Informed Consent: I have reviewed the patients History and Physical, chart, labs and discussed the procedure including the risks, benefits and alternatives for the proposed anesthesia with the patient or authorized representative who has indicated his/her understanding and acceptance.     Dental advisory given  Plan Discussed with: CRNA and Anesthesiologist  Anesthesia Plan Comments:        Anesthesia Quick Evaluation

## 2020-12-18 NOTE — Interval H&P Note (Signed)
History and Physical Interval Note:  12/18/2020 11:31 AM  Cassandra Vargas  has presented today for surgery, with the diagnosis of ovarian cyst and prolonged menses.  The various methods of treatment have been discussed with the patient and family. After consideration of risks, benefits and other options for treatment, the patient has consented to  Procedure(s): LAPAROSCOPIC UNILATERAL OOPHORECTOMY (Left) DILATATION AND CURETTAGE /HYSTEROSCOPY (N/A) HYSTEROSCOPY MYOMECTOMY (N/A) as a surgical intervention.  The patient's history has been reviewed, patient examined, no change in status, stable for surgery.  I have reviewed the patient's chart and labs.  Questions were answered to the patient's satisfaction.     Loretta Kluender K Taam-Akelman

## 2020-12-18 NOTE — Transfer of Care (Signed)
Immediate Anesthesia Transfer of Care Note  Patient: Cassandra Vargas  Procedure(s) Performed: Procedure(s) (LRB): DIAGNOSTIC LAPAROSCOPY, LEFT OVARIAN CYSTECTOMY, PELVIC WASHING (N/A) HYSTEROSCOPY/ DILATION AND CURETTAGE (N/A)  Patient Location: PACU  Anesthesia Type: General  Level of Consciousness: awake, sedated, patient cooperative and responds to stimulation  Airway & Oxygen Therapy: Patient Spontanous Breathing and Patient connected to [place] 02 and soft FM  Post-op Assessment: Report given to PACU RN, Post -op Vital signs reviewed and stable and Patient moving all extremities  Post vital signs: Reviewed and stable  Complications: No apparent anesthesia complications

## 2020-12-18 NOTE — Anesthesia Procedure Notes (Signed)
Procedure Name: Intubation Date/Time: 12/18/2020 12:02 PM Performed by: Justice Rocher, CRNA Pre-anesthesia Checklist: Patient identified, Emergency Drugs available, Suction available, Patient being monitored and Timeout performed Patient Re-evaluated:Patient Re-evaluated prior to induction Oxygen Delivery Method: Circle system utilized Preoxygenation: Pre-oxygenation with 100% oxygen Induction Type: IV induction Ventilation: Mask ventilation without difficulty Laryngoscope Size: Mac and 3 Grade View: Grade I Tube type: Oral Tube size: 7.0 mm Number of attempts: 1 Airway Equipment and Method: Stylet and Oral airway Placement Confirmation: ETT inserted through vocal cords under direct vision,  positive ETCO2,  breath sounds checked- equal and bilateral and CO2 detector Secured at: 22 cm Tube secured with: Tape Dental Injury: Teeth and Oropharynx as per pre-operative assessment

## 2020-12-18 NOTE — Op Note (Signed)
Operative Note PATIENT:  Briteny Donnella Sham  40 y.o. female  PRE-OPERATIVE DIAGNOSIS:  abnormal uterine bleeding, ovarian cyst  POST-OPERATIVE DIAGNOSIS:  abnormal uterine bleeding, ovarian cyst  PROCEDURE:  Procedure(s): DIAGNOSTIC LAPAROSCOPY, LEFT OVARIAN CYSTECTOMY, PELVIC WASHING (N/A) HYSTEROSCOPY/ DILATION AND CURETTAGE (N/A)  SURGEON:  Surgeon(s) and Role:    * Taam-Akelman, Lawrence Santiago, MD - Primary    * Rowland Lathe, MD - Assisting  ANESTHESIA:   general  EBL:  50 mL   BLOOD ADMINISTERED:none  DRAINS: none   LOCAL MEDICATIONS USED:  MARCAINE     SPECIMEN:  Source of Specimen:  left ovarian dermoid cyst, endometrial curretings   DISPOSITION OF SPECIMEN:  PATHOLOGY  COUNTS:  YES  TOURNIQUET:  * No tourniquets in log *  PLAN OF CARE: Discharge to home after PACU  PATIENT DISPOSITION:  PACU - hemodynamically stable.   Delay start of Pharmacological VTE agent (>24hrs) due to surgical blood loss or risk of bleeding: not applicable   Findings: Exam under anesthesia revealed small mobile anteverted uterus, fullness in the pelvis but no discrete ovarian mass palpated. Diagnostic laparoscopy revealed normal fallopian tubes with evidence of prior tubal via filshie clips, normal uterus, and normal appendix. Right ovary with 1cm simple cyst. Left ovary with 7cm cyst, with hair and fat contents consistent with dermoid cyst. Hysteroscopy showed proliferative endometrium, no evidence of polyp or fibroid. Fluid deficit = 100cc  Description of procedure After consent was verified, the patient was taken to the operating room where general anesthesia was administered without difficulty. The patient was placed in the dorsal lithotomy position using Allen stirrups.  Exam under anesthesia was performed, revealing a small mobile anteverted uterus, fullness in the pelvis but no discrete ovarian mass palpated. The abdomen and vagina were subsequently prepped and draped in  the normal sterile fashion. An in and out catheter was performed. A speculum was placed in the patient's vagina and the hulka manipulator was placed through the os and secured to the anterior lip. The tenaculum and speculum were removed.   Attention was then turned to the patient's abdomen where a 22mm vertical skin incision was made above the umbilicus, to the level of the fascia. The fascia was grasped and incised with the scalpel. The abdominal wall was tented and the 44mm Optiview trochar and sleeve were advanced, where intra-abdominal placement was confirmed by laparoscope. 5cc 0.25% marcaine was injected at each lateral port prior to port placement. A second 101mm skin incision was made approximately 4cm above the right anterior superior iliac spine.  The second trochar and sleeve were then advanced under direct visualization. A third 75mm skin incision was made and trochar was placed in similar fashion approximately 4cm above the left anterior superior iliac spine.   A blunt grasper was used to sweep the bowel superiorly and provide adequate visualization of the female anatomy. A survey of the patient's pelvis and abdomen revealed:  normal fallopian tubes with evidence of prior tubal via filshie clips, normal uterus and normal appendix. Right ovary with 1cm simple cyst. Left ovary with 7cm cyst.  Pelvic washing was performed with irrigation. The left ovarian wall was incised with monopolar scissors and inevitable rupture occurred, cyst contents include fat and hair, this was copiously irrigated. The cyst wall was then peeled from the ovary using traction and blunt dissection. The cyst was then placed in an endocatch bag which was removed through the umbilicus. The pelvis was irrigated again. The ovary was rendered hemostatic with surgiflo.  The intraabdominal pressure was lowered, and hemostasis was confirmed.   All instruments were removed from the patient's abdomen. The umbilucis fascia was closed with  vicryl on a UR 6 in a running fashion. The skin incisions were repaired with vicryl. The uterine manipulator was removed from the vagina with a small amount of bleeding from the cervix, which was hemostatic with pressure.   The anterior lip of the cervix was grasped with a single tooth tenaculum.  The cervix was sequentially dilated using pratt dilators to a #19. The hysteroscope was inserted into the cervix and advanced into the uterine cavity without difficulty. The endometrium was noted to be proliferative in apperance. No evidence of a fibroid distorting the cavity or a polyp. Bilateral ostia visualized. Sharp curettage was performed.  Post D&C hysteroscopy showed no uterine injury.   All instruments were removed from the vagina. Hemostasis was noted at the tenaculum sites with pressure. The patient tolerated the procedure well and was taken to the recovery room in stable condition. Sponge, lap, and needle counts were correct x3. Vanden Fawaz K Taam-Akelman 12/18/20 2:08 PM

## 2020-12-18 NOTE — Discharge Instructions (Signed)
Medications Take Motrin (ibuprofen) 600mg  every 6 hours as needed for pain Take percocet every 4-6 hours as needed for pain. If you are not taking percocet you can take tylenol (acetaminophen) 650mg  every 6 hour as needed for pain Continue taking prenatal vitamin or iron pill Take colace or a stool softener daily to prevent constipation Take gas-x for gas pains   Laparoscopy, Care After The following information offers guidance on how to care for yourself after your procedure. Your health care provider may also give you more specific instructions. If you have problems or questions, contact your health care provider. What can I expect after the procedure? After the procedure, it is common to have:  Mild discomfort in the abdomen.  Sore throat. Women who have laparoscopy with a pelvic examination may have mild cramping and fluid coming from the vagina for a few days after the procedure. Follow these instructions at home: Medicines  Take over-the-counter and prescription medicines only as told by your health care provider.  If you were prescribed an antibiotic medicine, take it as told by your health care provider. Do not stop taking the antibiotic even if you start to feel better.  Ask your health care provider if the medicine prescribed to you: ? Requires you to avoid driving or using machinery. ? Can cause constipation. You may need to take these actions to prevent or treat constipation:  Drink enough fluid to keep your urine pale yellow.  Take over-the-counter or prescription medicines.  Eat foods that are high in fiber, such as beans, whole grains, and fresh fruits and vegetables.  Limit foods that are high in fat and processed sugars, such as fried or sweet foods. Incision care  Follow instructions from your health care provider about how to take care of your incisions. Make sure you: ? Wash your hands with soap and water for at least 20 seconds before and after you change  your bandage (dressing). If soap and water are not available, use hand sanitizer. ? Change your dressing as told by your health care provider. ? Leave stitches (sutures), skin glue, or surgical tape in place. These skin closures may need to stay in place for 2 weeks or longer. If surgical tape edges start to loosen and curl up, you may trim the loose edges. Do not remove the surgical tape completely unless your health care provider tells you to do that.  Check your incision areas every day for signs of infection. Check for: ? Redness, swelling, or pain. ? Fluid or blood. ? Warmth. ? Pus or a bad smell.   Activity  Return to your normal activities as told by your health care provider. Ask your health care provider what activities are safe for you.  Do not lift anything that is heavier than 10 lb (4.5 kg), or the limit that you are told, until your health care provider says that it is safe.  Avoid sitting for a long time without moving. Get up to take short walks every 1-2 hours. This is important to improve blood flow and breathing. Ask for help if you feel weak or unsteady. General instructions  Do not use any products that contain nicotine or tobacco. These products include cigarettes, chewing tobacco, and vaping devices, such as e-cigarettes. If you need help quitting, ask your health care provider.  If you were given a sedative during the procedure, it can affect you for several hours. Do not drive or operate machinery until your health care provider says that  it is safe.  Do not take baths, swim, or use a hot tub until your health care provider approves. Ask your health care provider if you may take showers. You may only be allowed to take sponge baths.  Keep all follow-up visits. This is important. Contact a health care provider if:  You develop shoulder pain.  You feel light-headed or faint.  You are unable to pass gas or have a bowel movement.  You feel nauseous or you  vomit.  You develop a rash.  You have any of these signs of infection: ? Redness, swelling, or pain around an incision. ? Fluid or blood coming from an incision. ? Warmth coming from an incision. ? Pus or a bad smell coming from an incision. ? A fever or chills. Get help right away if:  You have severe pain.  You have vomiting that does not go away.  You have heavy bleeding from the vagina.  Any incision opens up.  You have trouble breathing.  You have chest pain. These symptoms may represent a serious problem that is an emergency. Do not wait to see if the symptoms will go away. Get medical help right away. Call your local emergency services (911 in the [place] Do not drive yourself to the hospital. Summary  After the procedure, it is common to have mild discomfort in the abdomen and a sore throat.  Check your incision areas every day for signs of infection.  Return to your normal activities as told by your health care provider. Ask your health care provider what activities are safe for you. This information is not intended to replace advice given to you by your health care provider. Make sure you discuss any questions you have with your health care provider.    Post Anesthesia Home Care Instructions  Activity: Get plenty of rest for the remainder of the day. A responsible individual must stay with you for 24 hours following the procedure.  For the next 24 hours, DO NOT: -Drive a car -Paediatric nurse -Drink alcoholic beverages -Take any medication unless instructed by your physician -Make any legal decisions or sign important papers.  Meals: Start with liquid foods such as gelatin or soup. Progress to regular foods as tolerated. Avoid greasy, spicy, heavy foods. If nausea and/or vomiting occur, drink only clear liquids until the nausea and/or vomiting subsides. Call your physician if vomiting continues.  Special Instructions/Symptoms: Your throat may feel dry or  sore from the anesthesia or the breathing tube placed in your throat during surgery. If this causes discomfort, gargle with warm salt water. The discomfort should disappear within 24 hours.  If you had a scopolamine patch placed behind your ear for the management of post- operative nausea and/or vomiting:  1. The medication in the patch is effective for 72 hours, after which it should be removed.  Wrap patch in a tissue and discard in the trash. Wash hands thoroughly with soap and water. 2. You may remove the patch earlier than 72 hours if you experience unpleasant side effects which may include dry mouth, dizziness or visual disturbances. 3. Avoid touching the patch. Wash your hands with soap and water after contact with the patch. 4. Remove patch by Thursday 12/21/2020

## 2020-12-18 NOTE — Brief Op Note (Signed)
12/18/2020  1:54 PM  PATIENT:  Cassandra Vargas  40 y.o. female  PRE-OPERATIVE DIAGNOSIS:  abnormal uterine bleeding, ovarian cyst  POST-OPERATIVE DIAGNOSIS:  abnormal uterine bleeding, ovarian cyst  PROCEDURE:  Procedure(s): DIAGNOSTIC LAPAROSCOPY, LEFT OVARIAN CYSTECTOMY, PELVIC WASHING (N/A) HYSTEROSCOPY/ DILATION AND CURETTAGE (N/A)  SURGEON:  Surgeon(s) and Role:    * Taam-Akelman, Lawrence Santiago, MD - Primary    * Rowland Lathe, MD - Assisting  ANESTHESIA:   general  EBL:  50 mL   BLOOD ADMINISTERED:none  DRAINS: none   LOCAL MEDICATIONS USED:  MARCAINE     SPECIMEN:  Source of Specimen:  left ovarian dermoid cyst, endometrial curretings   DISPOSITION OF SPECIMEN:  PATHOLOGY  COUNTS:  YES  TOURNIQUET:  * No tourniquets in log *  DICTATION: .Note written in EPIC  PLAN OF CARE: Discharge to home after PACU  PATIENT DISPOSITION:  PACU - hemodynamically stable.   Delay start of Pharmacological VTE agent (>24hrs) due to surgical blood loss or risk of bleeding: not applicable

## 2020-12-19 ENCOUNTER — Encounter (HOSPITAL_BASED_OUTPATIENT_CLINIC_OR_DEPARTMENT_OTHER): Payer: Self-pay | Admitting: Obstetrics & Gynecology

## 2020-12-19 NOTE — Anesthesia Postprocedure Evaluation (Signed)
Anesthesia Post Note  Patient: Cassandra Vargas  Procedure(s) Performed: DIAGNOSTIC LAPAROSCOPY, LEFT OVARIAN CYSTECTOMY, PELVIC WASHING (N/A Abdomen) HYSTEROSCOPY/ DILATION AND CURETTAGE (N/A Vagina )     Patient location during evaluation: PACU Anesthesia Type: General Level of consciousness: awake and alert Pain management: pain level controlled Vital Signs Assessment: post-procedure vital signs reviewed and stable Respiratory status: spontaneous breathing, nonlabored ventilation, respiratory function stable and patient connected to nasal cannula oxygen Cardiovascular status: blood pressure returned to baseline and stable Postop Assessment: no apparent nausea or vomiting Anesthetic complications: no   No complications documented.  Last Vitals:  Vitals:   12/18/20 1445 12/18/20 1510  BP: 107/77 108/77  Pulse: (!) 49 (!) 53  Resp: 12 14  Temp: (!) 36.4 C 36.4 C  SpO2: 99% 100%    Last Pain:  Vitals:   12/18/20 1515  TempSrc:   PainSc: 1                  Aamani Moose

## 2020-12-20 LAB — SURGICAL PATHOLOGY

## 2020-12-20 LAB — CYTOLOGY - NON PAP

## 2021-05-08 ENCOUNTER — Other Ambulatory Visit: Payer: Self-pay

## 2021-05-08 ENCOUNTER — Encounter: Payer: Self-pay | Admitting: Internal Medicine

## 2021-05-08 ENCOUNTER — Ambulatory Visit (INDEPENDENT_AMBULATORY_CARE_PROVIDER_SITE_OTHER): Payer: 59 | Admitting: Internal Medicine

## 2021-05-08 VITALS — BP 103/68 | HR 70 | Ht 64.0 in | Wt 112.6 lb

## 2021-05-08 DIAGNOSIS — D508 Other iron deficiency anemias: Secondary | ICD-10-CM

## 2021-05-08 DIAGNOSIS — U071 COVID-19: Secondary | ICD-10-CM

## 2021-05-08 DIAGNOSIS — Z8616 Personal history of COVID-19: Secondary | ICD-10-CM | POA: Diagnosis not present

## 2021-05-08 DIAGNOSIS — Z1159 Encounter for screening for other viral diseases: Secondary | ICD-10-CM | POA: Diagnosis not present

## 2021-05-08 DIAGNOSIS — Z0000 Encounter for general adult medical examination without abnormal findings: Secondary | ICD-10-CM

## 2021-05-08 NOTE — Assessment & Plan Note (Signed)
She is negative for hep C

## 2021-05-08 NOTE — Assessment & Plan Note (Signed)
We will check CBC and ferritin level

## 2021-05-08 NOTE — Progress Notes (Signed)
New Patient Office Visit  Subjective:  Patient ID: Cassandra Vargas, female    DOB: [birth year]  Age: 40 y.o. MRN: [id number]  CC:  Chief Complaint  Patient presents with   New Patient (Initial Visit)    Establishing care     HPI Patient presents for regur visit  Past Medical History:  Diagnosis Date   Anemia    COVID 2021   LOSS OF TASTE AND SMELL STUFFY NOSE X 2 WEEKS ALL SYMPTOMS RESOLVED    Ovarian cyst    Wears glasses      Current Outpatient Medications:    Cholecalciferol (VITAMIN D3) 20 MCG (800 UNIT) TABS, Take by mouth., Disp: , Rfl:    ferrous sulfate 325 (65 FE) MG tablet, Take 1 tablet by mouth daily., Disp: , Rfl:    ibuprofen (ADVIL) 600 MG tablet, Take 1 tablet (600 mg total) by mouth every 6 (six) hours as needed., Disp: 30 tablet, Rfl: 0   Multiple Vitamins-Minerals (HAIR SKIN AND NAILS FORMULA PO), Take by mouth., Disp: , Rfl:    oxyCODONE-acetaminophen (PERCOCET) 5-325 MG tablet, Take 1 tablet by mouth every 6 (six) hours as needed for severe pain., Disp: 5 tablet, Rfl: 0   Past Surgical History:  Procedure Laterality Date   HYSTEROSCOPY N/A 12/18/2020   Procedure: HYSTEROSCOPY/ DILATION AND CURETTAGE;  Surgeon: Jonelle Sidle, MD;  Location: [facility];  Service: Gynecology;  Laterality: N/A;   LAPAROSCOPIC BILATERAL SALPINGECTOMY N/A 12/18/2020   Procedure: DIAGNOSTIC LAPAROSCOPY, LEFT OVARIAN CYSTECTOMY, PELVIC WASHING;  Surgeon: Jonelle Sidle, MD;  Location: [facility];  Service: Gynecology;  Laterality: N/A;   TUBAL LIGATION Bilateral 07/09/2015   Procedure: POST PARTUM TUBAL LIGATION;  Surgeon: Olga Millers, MD;  Location: [facility];  Service: Gynecology;  Laterality: Bilateral;    Family History  Problem Relation Age of Onset   Crohn's disease Sister    Crohn's disease Maternal Aunt    Alcohol abuse Neg Hx    Colon cancer Neg Hx    Breast cancer Neg Hx     Social History   Socioeconomic  History   Marital status: Married    Spouse name: Not on file   Number of children: Not on file   Years of education: Not on file   Highest education level: Not on file  Occupational History   Not on file  Tobacco Use   Smoking status: Never   Smokeless tobacco: Never  Vaping Use   Vaping Use: Never used  Substance and Sexual Activity   Alcohol use: No   Drug use: No   Sexual activity: Yes  Other Topics Concern   Not on file  Social History Narrative   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   Social Determinants of Health   Financial Resource Strain: Not on file  Food Insecurity: Not on file  Transportation Needs: Not on file  Physical Activity: Not on file  Stress: Not on file  Social Connections: Not on file  Intimate Partner Violence: Not on file    ROS Review of Systems  Constitutional: Negative.   HENT: Negative.    Eyes: Negative.   Respiratory: Negative.    Cardiovascular: Negative.   Gastrointestinal: Negative.   Endocrine: Negative.   Genitourinary: Negative.   Musculoskeletal: Negative.   Skin: Negative.   Allergic/Immunologic: Negative.   Neurological: Negative.   Hematological: Negative.   Psychiatric/Behavioral: Negative.    All other systems reviewed and  are negative.  Objective:   Today's Vitals: BP 103/68   Pulse 70   Ht 5\' 4"  (1.626 m)   Wt 112 lb 9.6 oz (51.1 kg)   BMI 19.33 kg/m   Physical Exam Vitals reviewed.  HENT:     Head: Normocephalic.     Right Ear: Tympanic membrane normal.     Left Ear: Tympanic membrane normal.  Eyes:     Pupils: Pupils are equal, round, and reactive to light.  Cardiovascular:     Rate and Rhythm: Normal rate and regular rhythm.  Pulmonary:     Effort: Pulmonary effort is normal.     Breath sounds: Normal breath sounds.  Abdominal:     General: Abdomen is flat. Bowel sounds are normal.     Palpations: Abdomen is soft.  Musculoskeletal:        General: Normal range of motion.      Cervical back: Normal range of motion.  Skin:    General: Skin is warm.  Neurological:     General: No focal deficit present.  Psychiatric:        Mood and Affect: Mood normal.    Assessment & Plan:   Problem List Items Addressed This Visit       Other   Anemia    We will do a complete lab test      Encounter for HCV screening test for low risk patient    She is negative for hep C      Iron deficiency anemia    We will check CBC and ferritin level      COVID-19 virus infection - Primary    Patient is okay and normal she denies any history of fatigue or tiredness, she denies any chest pain or shortness of breath. .  Heart is regular chest is clear abdominal soft nontender without any hepatosplenomegaly there is no pedal edema no calf tenderness.      Preventative health care    Patient was advised to walk on a daily basis       Outpatient Encounter Medications as of 05/08/2021  Medication Sig   Cholecalciferol (VITAMIN D3) 20 MCG (800 UNIT) TABS Take by mouth.   ferrous sulfate 325 (65 FE) MG tablet Take 1 tablet by mouth daily.   ibuprofen (ADVIL) 600 MG tablet Take 1 tablet (600 mg total) by mouth every 6 (six) hours as needed.   Multiple Vitamins-Minerals (HAIR SKIN AND NAILS FORMULA PO) Take by mouth.   oxyCODONE-acetaminophen (PERCOCET) 5-325 MG tablet Take 1 tablet by mouth every 6 (six) hours as needed for severe pain.   No facility-administered encounter medications on file as of 05/08/2021.    Follow-up: No follow-ups on file.   Cletis Athens, MD

## 2021-05-08 NOTE — Assessment & Plan Note (Signed)
Patient is okay and normal she denies any history of fatigue or tiredness, she denies any chest pain or shortness of breath. .  Heart is regular chest is clear abdominal soft nontender without any hepatosplenomegaly there is no pedal edema no calf tenderness.

## 2021-05-08 NOTE — Assessment & Plan Note (Signed)
Patient was advised to walk on a daily basis

## 2021-05-08 NOTE — Assessment & Plan Note (Signed)
We will do a complete lab test

## 2021-05-09 ENCOUNTER — Ambulatory Visit (INDEPENDENT_AMBULATORY_CARE_PROVIDER_SITE_OTHER): Payer: 59 | Admitting: Internal Medicine

## 2021-05-09 DIAGNOSIS — Z0000 Encounter for general adult medical examination without abnormal findings: Secondary | ICD-10-CM

## 2021-05-09 DIAGNOSIS — D508 Other iron deficiency anemias: Secondary | ICD-10-CM

## 2021-05-10 LAB — CBC WITH DIFFERENTIAL/PLATELET
Absolute Monocytes: 254 cells/uL (ref 200–950)
Basophils Absolute: 31 cells/uL (ref 0–200)
Basophils Relative: 1 %
Eosinophils Absolute: 112 cells/uL (ref 15–500)
Eosinophils Relative: 3.6 %
HCT: 40.5 % (ref 35.0–45.0)
Hemoglobin: 13.3 g/dL (ref 11.7–15.5)
Lymphs Abs: 1125 cells/uL (ref 850–3900)
MCH: 30.4 pg (ref 27.0–33.0)
MCHC: 32.8 g/dL (ref 32.0–36.0)
MCV: 92.5 fL (ref 80.0–100.0)
MPV: 11 fL (ref 7.5–12.5)
Monocytes Relative: 8.2 %
Neutro Abs: 1578 cells/uL (ref 1500–7800)
Neutrophils Relative %: 50.9 %
Platelets: 210 10*3/uL (ref 140–400)
RBC: 4.38 10*6/uL (ref 3.80–5.10)
RDW: 11.7 % (ref 11.0–15.0)
Total Lymphocyte: 36.3 %
WBC: 3.1 10*3/uL — ABNORMAL LOW (ref 3.8–10.8)

## 2021-05-10 LAB — COMPLETE METABOLIC PANEL WITH GFR
AG Ratio: 1.5 (calc) (ref 1.0–2.5)
ALT: 9 U/L (ref 6–29)
AST: 14 U/L (ref 10–30)
Albumin: 4.3 g/dL (ref 3.6–5.1)
Alkaline phosphatase (APISO): 38 U/L (ref 31–125)
BUN: 10 mg/dL (ref 7–25)
CO2: 25 mmol/L (ref 20–32)
Calcium: 9.7 mg/dL (ref 8.6–10.2)
Chloride: 104 mmol/L (ref 98–110)
Creat: 0.76 mg/dL (ref 0.50–0.97)
Globulin: 2.9 g/dL (calc) (ref 1.9–3.7)
Glucose, Bld: 87 mg/dL (ref 65–99)
Potassium: 4 mmol/L (ref 3.5–5.3)
Sodium: 140 mmol/L (ref 135–146)
Total Bilirubin: 1 mg/dL (ref 0.2–1.2)
Total Protein: 7.2 g/dL (ref 6.1–8.1)
eGFR: 103 mL/min/{1.73_m2} (ref >=60)

## 2021-05-10 LAB — TSH: TSH: 1.19 mIU/L

## 2021-05-10 LAB — LIPID PANEL
Cholesterol: 136 mg/dL (ref <200)
HDL: 59 mg/dL (ref >=50)
LDL Cholesterol (Calc): 66 mg/dL (calc)
Non-HDL Cholesterol (Calc): 77 mg/dL (calc) (ref <130)
Total CHOL/HDL Ratio: 2.3 (calc) (ref <5.0)
Triglycerides: 40 mg/dL (ref <150)

## 2021-05-10 LAB — FERRITIN: Ferritin: 22 ng/mL (ref 16–154)

## 2021-10-07 IMAGING — US US PELVIS COMPLETE WITH TRANSVAGINAL
1 series · 13 of 25 positions shown · non-contrast
Comparison: None

CLINICAL DATA: Excessive and frequent menstruation

EXAM:
TRANSABDOMINAL AND TRANSVAGINAL ULTRASOUND OF PELVIS
TECHNIQUE: Both transabdominal and transvaginal ultrasound examinations of the
pelvis were performed. Transabdominal technique was performed for
global imaging of the pelvis including uterus, ovaries, adnexal
regions, and pelvic cul-de-sac. It was necessary to proceed with
endovaginal exam following the transabdominal exam to visualize the
uterus endometrium ovaries.

[Series 1: us pelvis complete with transvaginal · 0.19mm/px · 102 acquisitions, 13 frames shown]
[im 1/102]
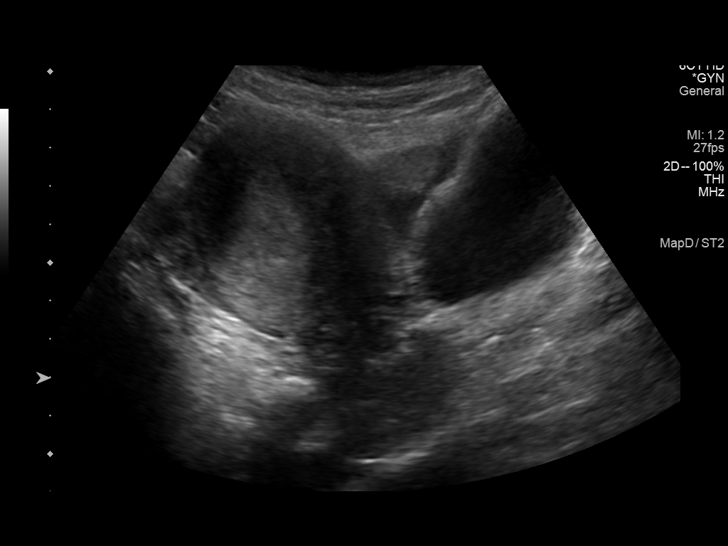
[im 9/102]
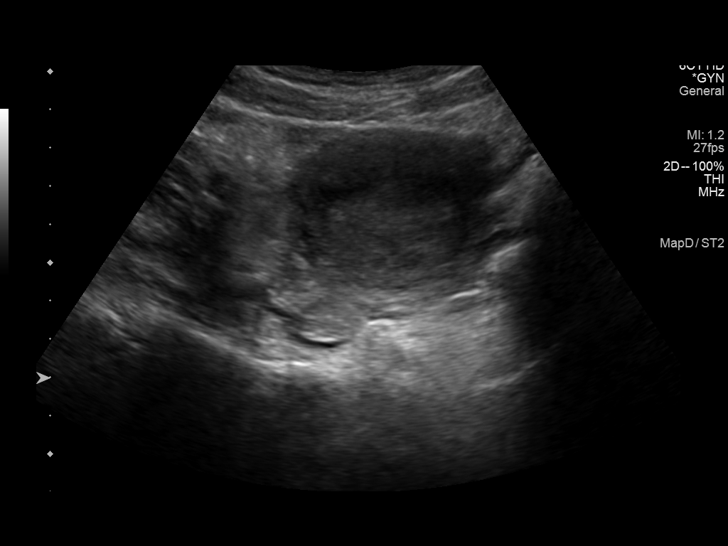
[im 17/102]
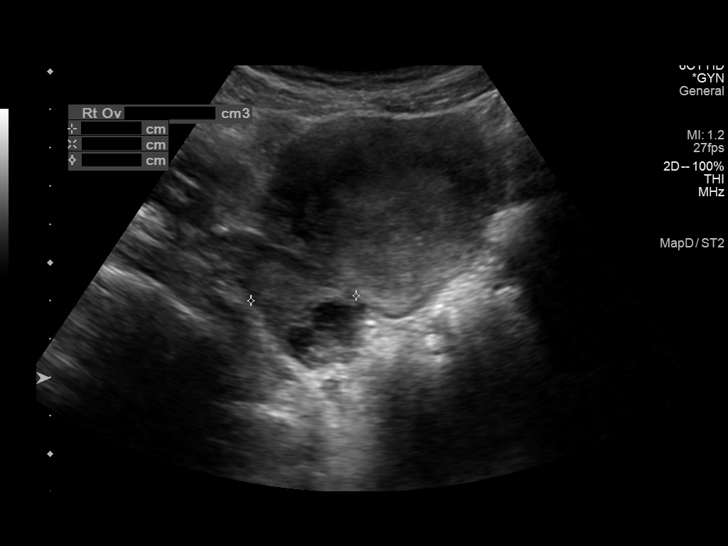
[im 26/102]
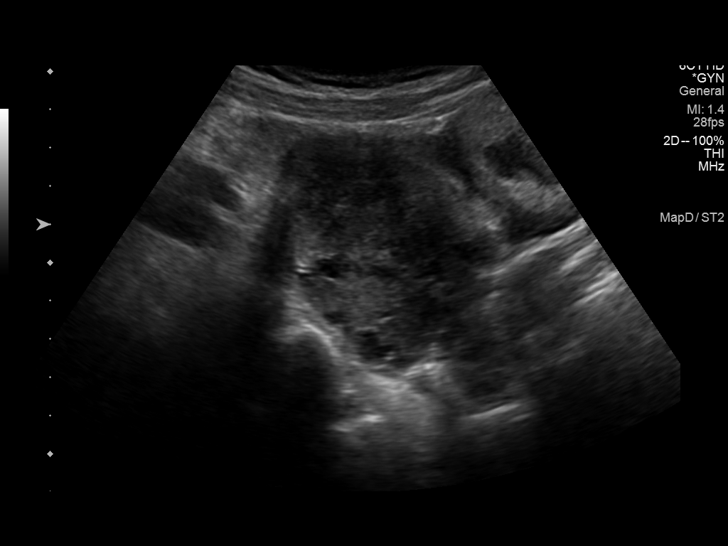
[im 34/102]
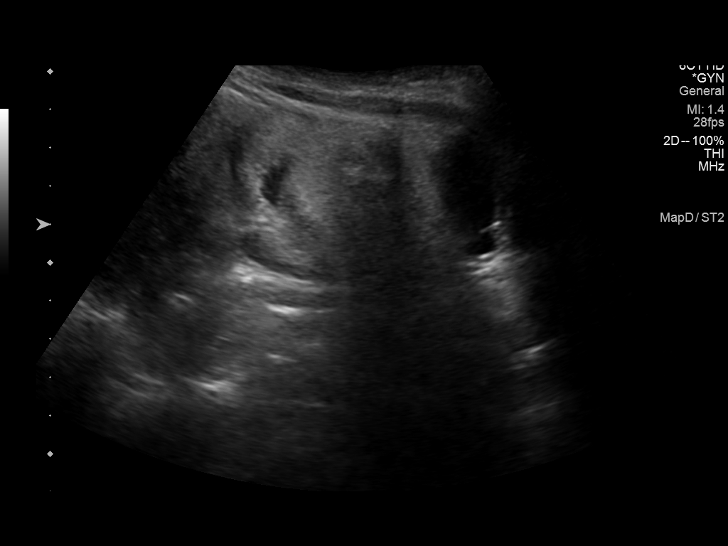
[im 43/102]
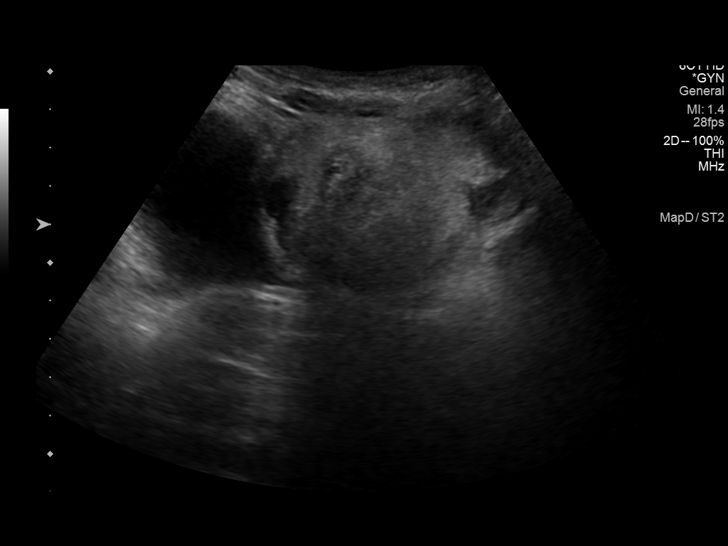
[im 51/102]
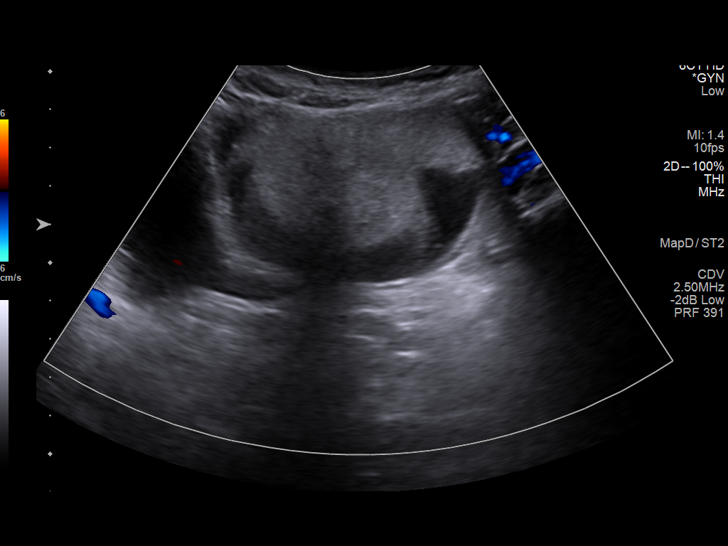
[im 59/102]
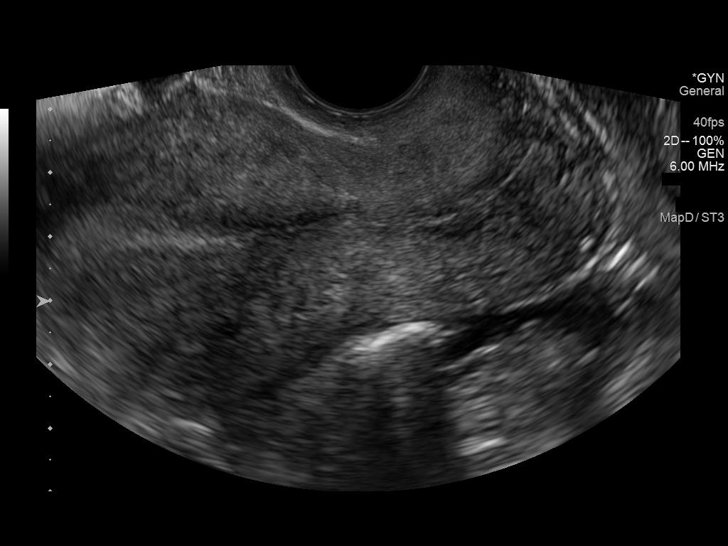
[im 68/102]
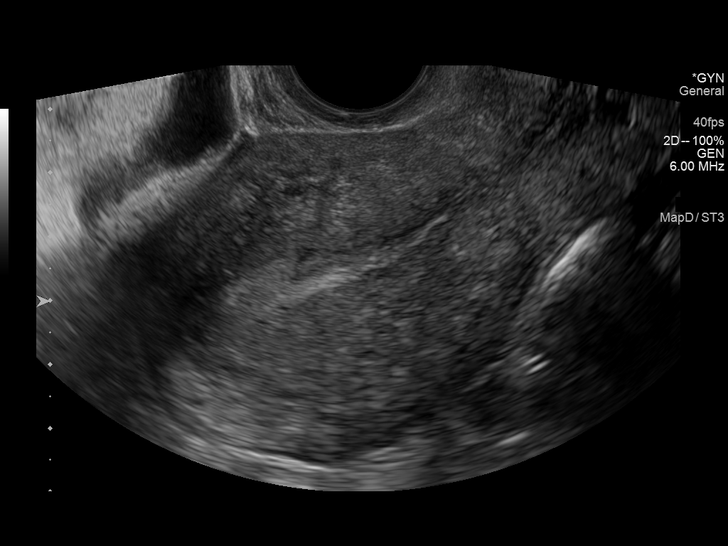
[im 76/102]
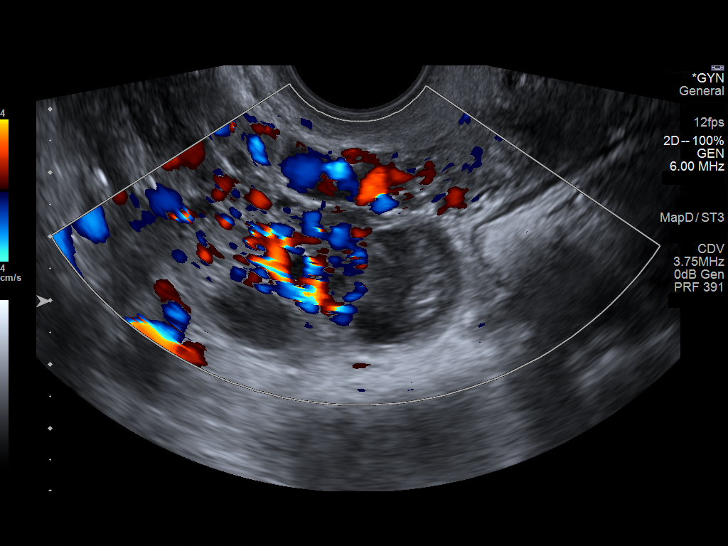
[im 85/102]
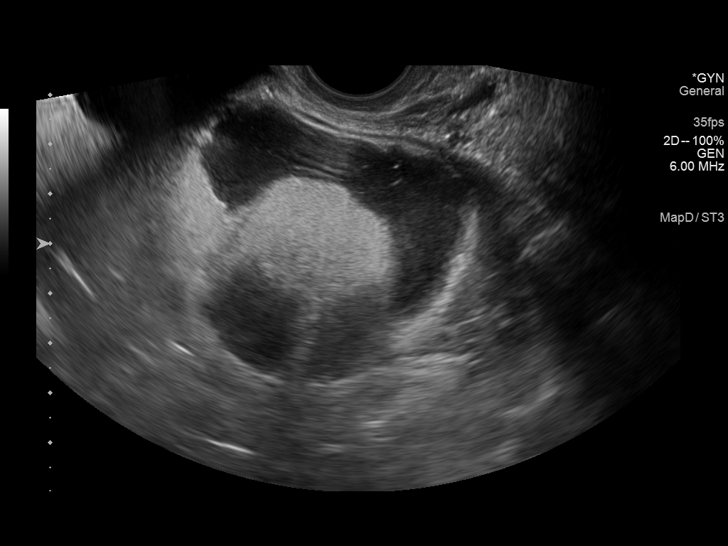
[im 93/102]
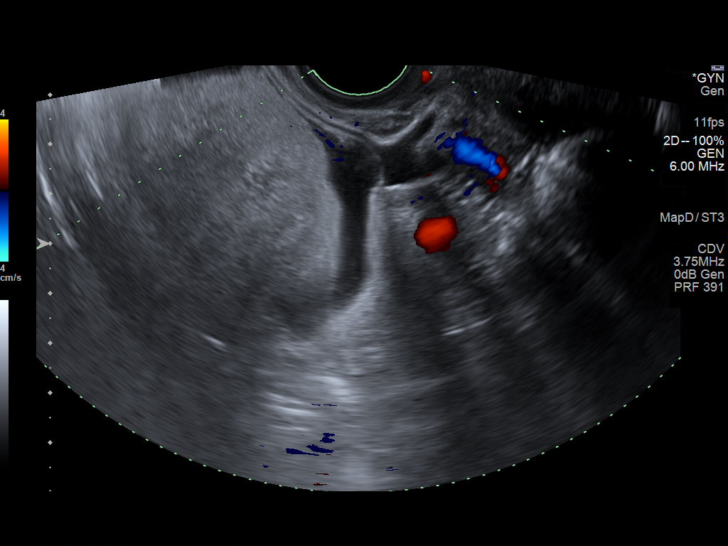
[im 102/102]
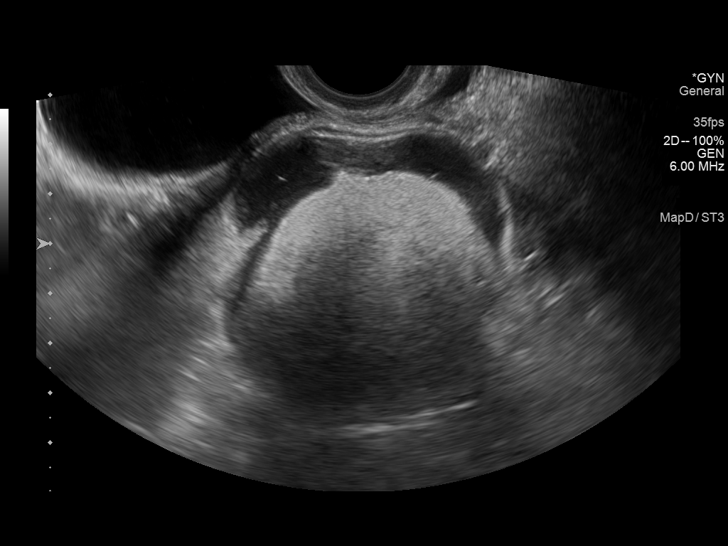

[13 of 25 positions shown; findings below may reference images not displayed]

FINDINGS: Uterus

Measurements: 10.6 x 5.1 x 5.9 cm = volume: 165 mL. No fibroids or
other mass visualized.

Endometrium

Thickness: 11 mm.  No focal abnormality visualized.

Right ovary

Measurements: 3.8 x 2.4 x 2.8 cm = volume: 13 mL. Complex cyst in
the right ovary measuring 3.1 x 2.4 x 3.1 cm

Left ovary

Measurements: 2.9 x 1.8 x 2.4 cm = volume: 6 mL. Large complex left
adnexal mass measuring 7.3 x 5.6 x 7.9 cm, contains large echogenic
component with peripheral fluid echogenicity.

Other findings

Small free fluid.
IMPRESSION: 1. Large 7.9 cm complex cystic adnexal mass with features suspicious
for large dermoid. Surgical consultation is recommended.
2. 3.1 cm complex cyst in the right ovary indeterminate for
hemorrhagic cyst versus endometrioma, recommend 6-12 week
sonographic follow-up.
3. Small free fluid in the pelvis

These results will be called to the ordering clinician or
representative by the Radiologist Assistant, and communication
documented in the PACS or [REDACTED].

## 2021-11-16 ENCOUNTER — Encounter: Payer: Self-pay | Admitting: Nurse Practitioner

## 2021-11-16 ENCOUNTER — Ambulatory Visit: Payer: 59 | Admitting: Nurse Practitioner

## 2021-11-16 VITALS — BP 91/59 | HR 73 | Ht 64.0 in | Wt 113.6 lb

## 2021-11-16 DIAGNOSIS — D508 Other iron deficiency anemias: Secondary | ICD-10-CM

## 2021-11-16 NOTE — Progress Notes (Signed)
? ?Established Patient Office Visit ? ?Subjective:  ?Patient ID: Cassandra Vargas, female    DOB: [birth year]  Age: 41 y.o. MRN: [id number] ? ?CC:  ?Chief Complaint  ?Patient presents with  ? Lab Results  ? ? ? ?HPI ? ?Birgit Donnella Sham presents for follow up on her iron deficiency anemia. She has been taking the Vitron C 325 mg daily. Patient is over all doing well. She does not feel fatigued, dizzy and have no complaints at present. 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  ? ?HPI  ? ?Past Medical History:  ?Diagnosis Date  ? Anemia   ? COVID 2021  ? LOSS OF TASTE AND SMELL STUFFY NOSE X 2 WEEKS ALL SYMPTOMS RESOLVED   ? Ovarian cyst   ? Wears glasses   ? ? ?Past Surgical History:  ?Procedure Laterality Date  ? HYSTEROSCOPY N/A 12/18/2020  ? Procedure: HYSTEROSCOPY/ DILATION AND CURETTAGE;  Surgeon: Jonelle Sidle, MD;  Location: [facility];  Service: Gynecology;  Laterality: N/A;  ? LAPAROSCOPIC BILATERAL SALPINGECTOMY N/A 12/18/2020  ? Procedure: DIAGNOSTIC LAPAROSCOPY, LEFT OVARIAN CYSTECTOMY, PELVIC WASHING;  Surgeon: Jonelle Sidle, MD;  Location: [facility];  Service: Gynecology;  Laterality: N/A;  ? TUBAL LIGATION Bilateral 07/09/2015  ? Procedure: POST PARTUM TUBAL LIGATION;  Surgeon: Olga Millers, MD;  Location: [facility];  Service: Gynecology;  Laterality: Bilateral;  ? ? ?Family History  ?Problem Relation Age of Onset  ? Crohn's disease Sister   ? Crohn's disease Maternal Aunt   ? Alcohol abuse Neg Hx   ? Colon cancer Neg Hx   ? Breast cancer Neg Hx   ? ? ?Social History  ? ?Socioeconomic History  ? Marital status: Married  ?  Spouse name: Not on file  ? Number of children: Not on file  ? Years of education: Not on file  ? Highest education level: Not on file  ?Occupational History  ? Not on file  ?Tobacco Use  ? Smoking status: Never  ? Smokeless tobacco: Never  ?Vaping Use  ? Vaping Use: Never used  ?Substance and Sexual Activity  ? Alcohol use: No   ? Drug use: No  ? Sexual activity: Yes  ?Other Topics Concern  ? Not on file  ?Social History Narrative  ? 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  ? ?Social Determinants of Health  ? ?Financial Resource Strain: Not on file  ?Food Insecurity: Not on file  ?Transportation Needs: Not on file  ?Physical Activity: Not on file  ?Stress: Not on file  ?Social Connections: Not on file  ?Intimate Partner Violence: Not on file  ? ? ? ?Outpatient Medications Prior to Visit  ?Medication Sig Dispense Refill  ? ferrous sulfate 325 (65 FE) MG tablet Take 1 tablet by mouth daily.    ? Cholecalciferol (VITAMIN D3) 20 MCG (800 UNIT) TABS Take by mouth. (Patient not taking: Reported on 11/16/2021)    ? ibuprofen (ADVIL) 600 MG tablet Take 1 tablet (600 mg total) by mouth every 6 (six) hours as needed. (Patient not taking: Reported on 11/16/2021) 30 tablet 0  ? Multiple Vitamins-Minerals (HAIR SKIN AND NAILS FORMULA PO) Take by mouth. (Patient not taking: Reported on 11/16/2021)    ? oxyCODONE-acetaminophen (PERCOCET) 5-325 MG tablet Take 1 tablet by mouth every 6 (six) hours as needed for severe pain. (Patient not taking: Reported on 11/16/2021) 5 tablet 0  ? ?No facility-administered medications prior to visit.  ? ? ?  No Known Allergies ? ?ROS ?Review of Systems  ?Constitutional:  Negative for activity change, appetite change and fatigue.  ?HENT:  Negative for congestion, facial swelling and sneezing.   ?Eyes: Negative.   ?Respiratory:  Negative for chest tightness, shortness of breath and wheezing.   ?Cardiovascular:  Negative for chest pain and palpitations.  ?Gastrointestinal:  Negative for abdominal pain, blood in stool and constipation.  ?Genitourinary:  Negative for difficulty urinating, menstrual problem and vaginal discharge.  ?Musculoskeletal: Negative.   ?Skin:  Negative for color change and pallor.  ?Neurological:  Negative for dizziness, light-headedness, numbness and headaches.  ?Psychiatric/Behavioral:  Negative  for agitation, behavioral problems and confusion.   ? ?  ?Objective:  ?  ?Physical Exam ?Constitutional:   ?   Appearance: Normal appearance. She is normal weight.  ?HENT:  ?   Head: Normocephalic and atraumatic.  ?   Right Ear: Tympanic membrane normal.  ?   Left Ear: Tympanic membrane normal.  ?   Nose: Nose normal.  ?   Mouth/Throat:  ?   Mouth: Mucous membranes are moist.  ?Eyes:  ?   Extraocular Movements: Extraocular movements intact.  ?   Conjunctiva/sclera: Conjunctivae normal.  ?   Pupils: Pupils are equal, round, and reactive to light.  ?Cardiovascular:  ?   Rate and Rhythm: Normal rate and regular rhythm.  ?   Pulses: Normal pulses.  ?   Heart sounds: Normal heart sounds.  ?Pulmonary:  ?   Effort: Pulmonary effort is normal.  ?   Breath sounds: Normal breath sounds.  ?Abdominal:  ?   General: Abdomen is flat. Bowel sounds are normal.  ?   Palpations: Abdomen is soft.  ?Musculoskeletal:     ?   General: Normal range of motion.  ?   Cervical back: Normal range of motion.  ?Skin: ?   General: Skin is warm.  ?   Capillary Refill: Capillary refill takes less than 2 seconds.  ?Neurological:  ?   General: No focal deficit present.  ?   Mental Status: She is alert and oriented to person, place, and time. Mental status is at baseline.  ?Psychiatric:     ?   Mood and Affect: Mood normal.     ?   Behavior: Behavior normal.     ?   Thought Content: Thought content normal.     ?   Judgment: Judgment normal.  ? ? ?BP (!) 91/59   Pulse 73   Ht '5\' 4"'  (1.626 m)   Wt 113 lb 9.6 oz (51.5 kg)   BMI 19.50 kg/m?  ?Wt Readings from Last 3 Encounters:  ?11/16/21 113 lb 9.6 oz (51.5 kg)  ?05/08/21 112 lb 9.6 oz (51.1 kg)  ?12/18/20 115 lb 9.6 oz (52.4 kg)  ? ? ? ?Health Maintenance Due  ?Topic Date Due  ? PAP SMEAR-Modifier  10/17/2020  ? INFLUENZA VACCINE  03/19/2021  ? ? ?There are no preventive care reminders to display for this patient. ? ?Lab Results  ?Component Value Date  ? TSH 1.19 05/09/2021  ? ?Lab Results   ?Component Value Date  ? WBC 3.1 (L) 05/09/2021  ? HGB 13.3 05/09/2021  ? HCT 40.5 05/09/2021  ? MCV 92.5 05/09/2021  ? PLT 210 05/09/2021  ? ?Lab Results  ?Component Value Date  ? NA 140 05/09/2021  ? K 4.0 05/09/2021  ? CO2 25 05/09/2021  ? GLUCOSE 87 05/09/2021  ? BUN 10 05/09/2021  ? CREATININE 0.76 05/09/2021  ?  BILITOT 1.0 05/09/2021  ? ALKPHOS 40 08/10/2018  ? AST 14 05/09/2021  ? ALT 9 05/09/2021  ? PROT 7.2 05/09/2021  ? ALBUMIN 4.5 08/10/2018  ? CALCIUM 9.7 05/09/2021  ? ANIONGAP 12 02/28/2016  ? EGFR 103 05/09/2021  ? GFR 120.46 08/10/2018  ? ?Lab Results  ?Component Value Date  ? CHOL 136 05/09/2021  ? ?Lab Results  ?Component Value Date  ? HDL 59 05/09/2021  ? ?Lab Results  ?Component Value Date  ? [place] 66 05/09/2021  ? ?Lab Results  ?Component Value Date  ? TRIG 40 05/09/2021  ? ?Lab Results  ?Component Value Date  ? CHOLHDL 2.3 05/09/2021  ? ?Lab Results  ?Component Value Date  ? HGBA1C 5.3 06/20/2020  ? ? ?  ?Assessment & Plan:  ? ?Problem List Items Addressed This Visit   ? ?  ? Other  ? Iron deficiency anemia - Primary  ?  Taking iron supplement. ?Would check Fe, Ferritin and TIBC. ?  ?  ? Relevant Orders  ? Fe+TIBC+Fer  ? ? ? ?No orders of the defined types were placed in this encounter. ? ? ? ?Follow-up: No follow-ups on file.  ? ? ?Theresia Lo, NP ?

## 2021-11-16 NOTE — Assessment & Plan Note (Signed)
Taking iron supplement. ?Would check Fe, Ferritin and TIBC. ?

## 2021-11-17 LAB — IRON,TIBC AND FERRITIN PANEL
%SAT: 36 % (calc) (ref 16–45)
Ferritin: 14 ng/mL — ABNORMAL LOW (ref 16–154)
Iron: 116 ug/dL (ref 40–190)
TIBC: 321 mcg/dL (calc) (ref 250–450)

## 2021-11-17 LAB — EXTRA LAV TOP TUBE

## 2021-12-08 IMAGING — US US PELVIS COMPLETE WITH TRANSVAGINAL
1 series · 13 of 25 positions shown · non-contrast
Comparison: Pelvic ultrasound August 29, 2020

CLINICAL DATA: Vaginal spotting.  Known ovarian mass

EXAM:
TRANSABDOMINAL AND TRANSVAGINAL ULTRASOUND OF PELVIS
TECHNIQUE: Study was performed transabdominally to optimize pelvic field of
view evaluation and transvaginally to optimize internal visceral
architecture evaluation.

[Series 1: us pelvis complete with transvaginal · 0.23mm/px · 101 acquisitions, 13 frames shown]
[im 1/101]
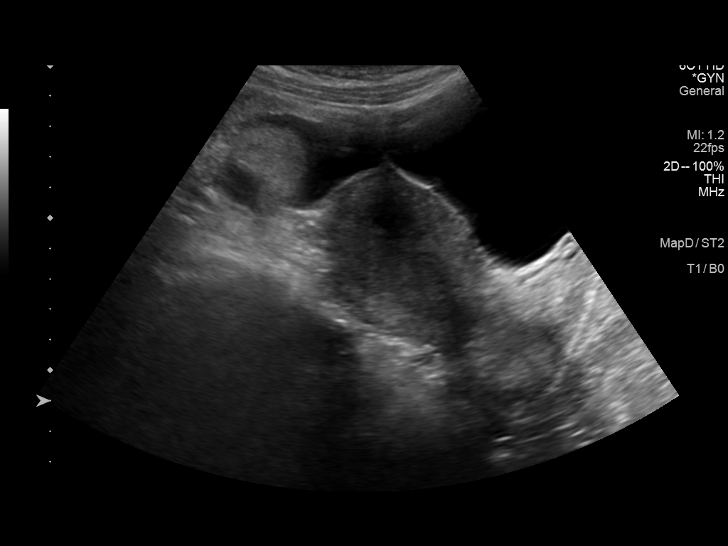
[im 9/101]
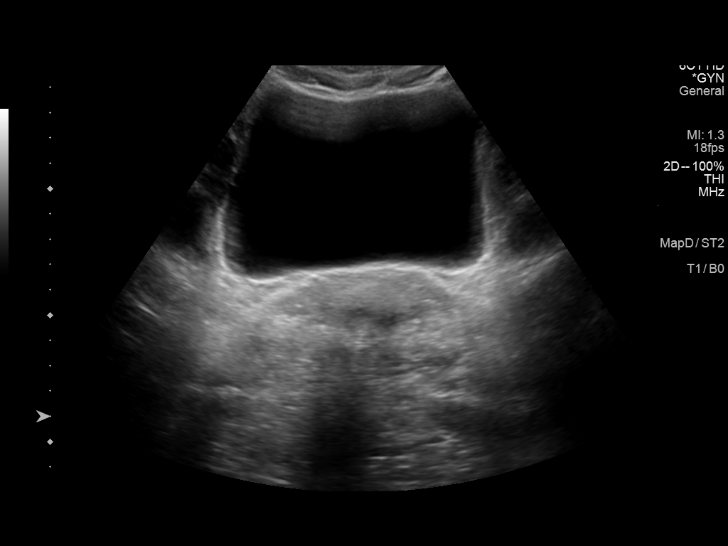
[im 17/101]
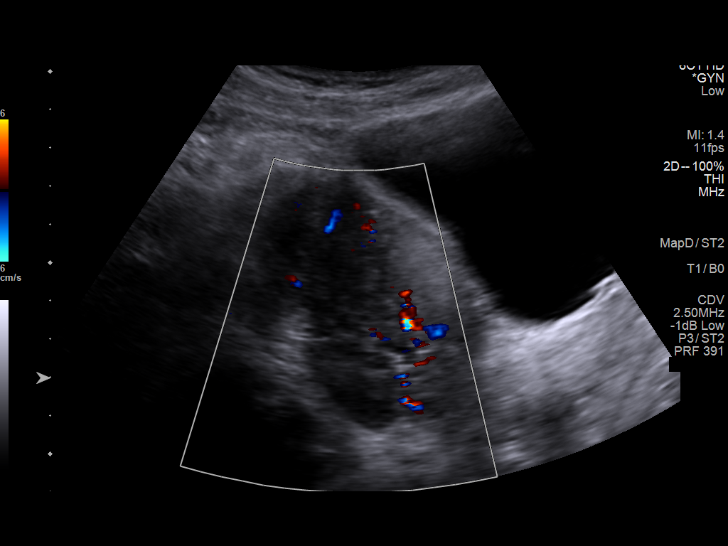
[im 26/101]
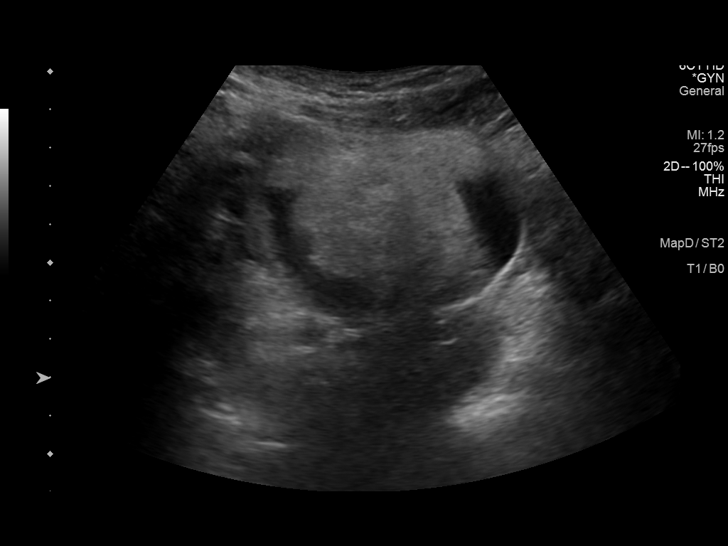
[im 34/101]
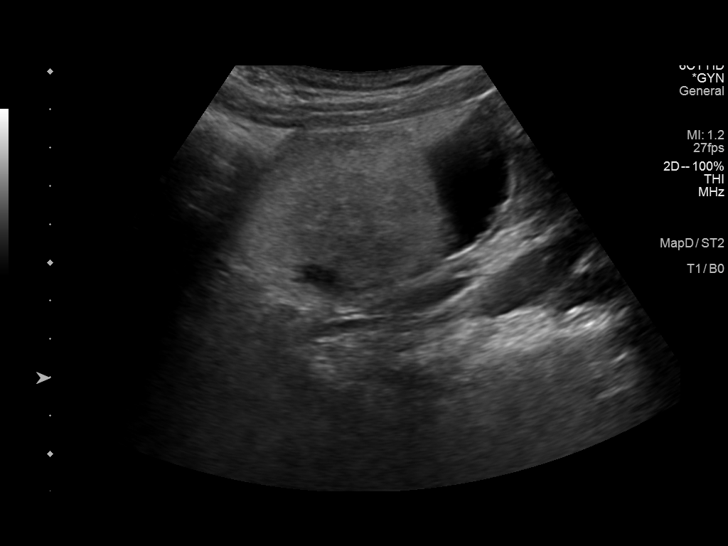
[im 42/101]
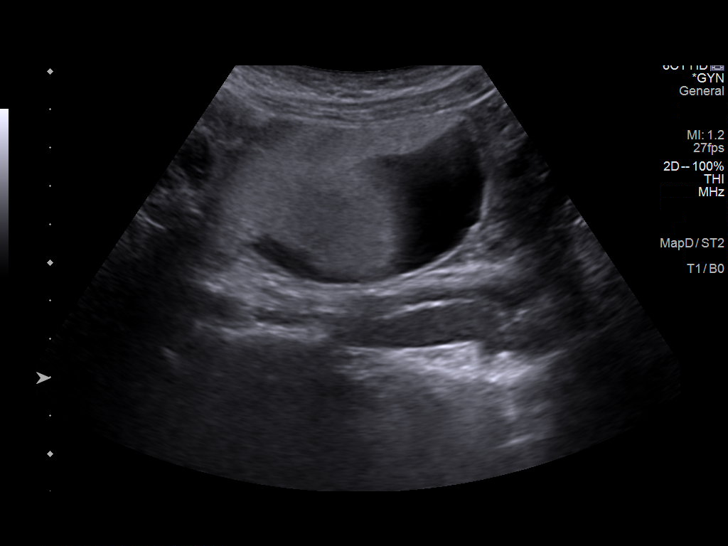
[im 51/101]
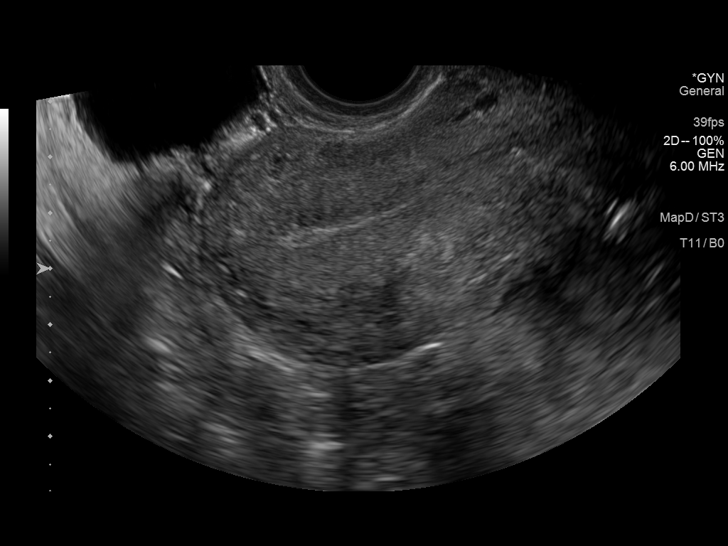
[im 59/101]
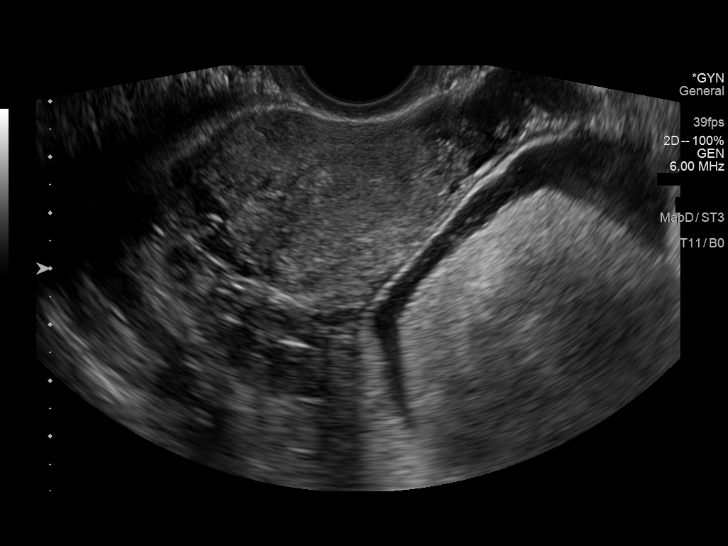
[im 67/101]
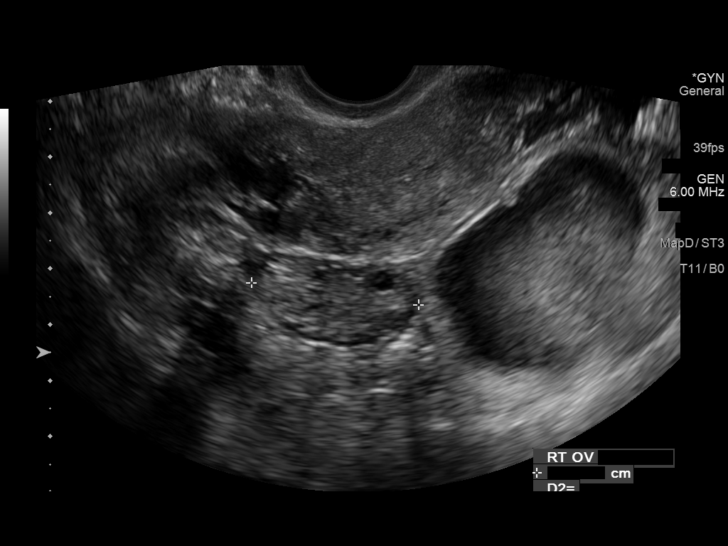
[im 76/101]
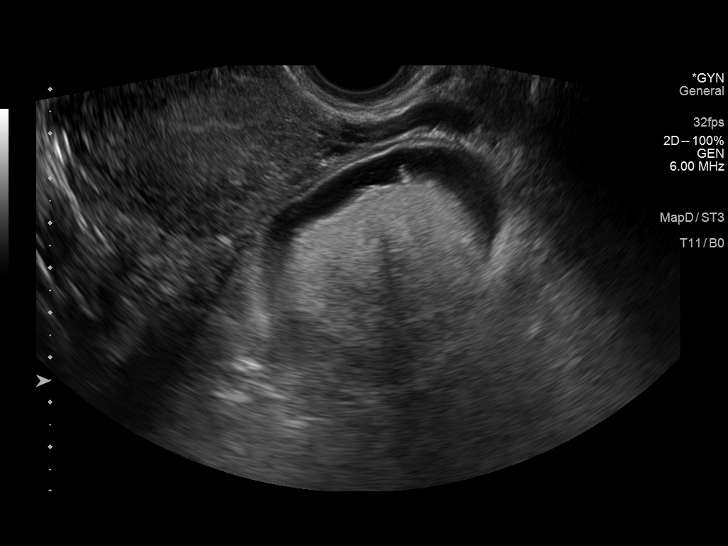
[im 84/101]
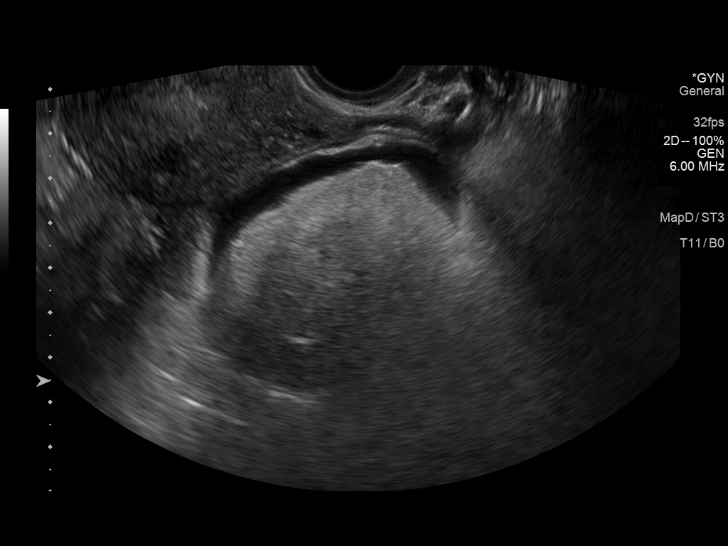
[im 92/101]
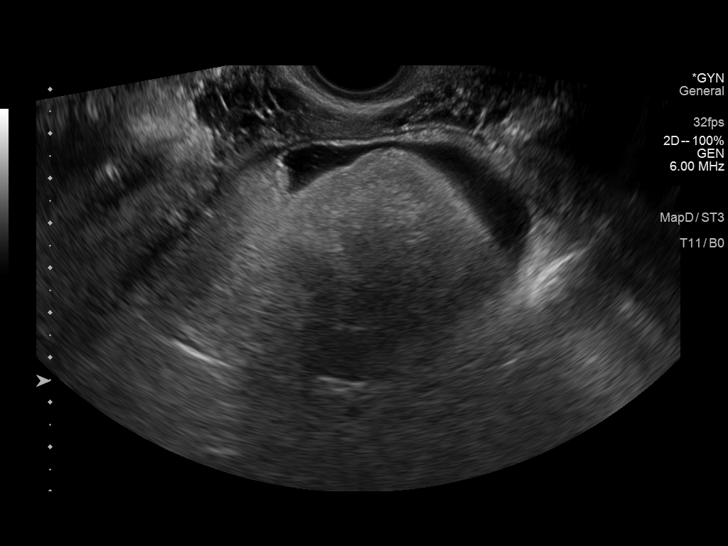
[im 101/101]
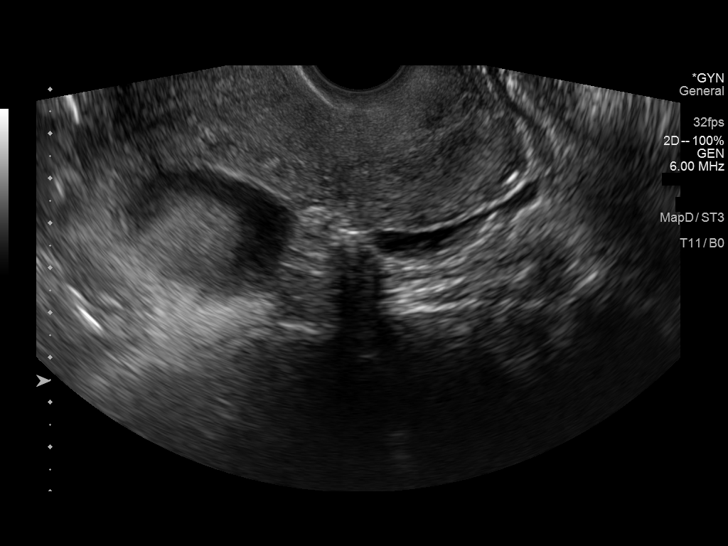

[13 of 25 positions shown; findings below may reference images not displayed]

FINDINGS: Uterus

Measurements: 9.5 x 5.0 x 5.3 cm = volume: 132 mL. No fibroids or
other mass visualized.

Endometrium

Thickness: 4 mm.  No focal abnormality visualized.

Right ovary

Measurements: 4.5 x 1.4 x 3.0 cm = volume: 9.8 mL. Normal
appearance/no adnexal mass.

Left ovary

No appreciable normal ovarian tissue on the left seen. In the left
adnexa, there is a focal mass measuring a 7.6 x 5.9 x 6.5 cm which
contains fairly homogeneous echogenic material potentially
representing fat with fluid interspersed within this mass. This mass
was present on prior study as well.

Other findings

Trace free fluid.
IMPRESSION: 1. Ovarian mass on the left again seen measuring 7.6 x 5.9 x 6.5 cm.
Suspect ovarian dermoid.

2.  No right-sided adnexal mass seen on current examination.

3.  Uterus and endometrium appear unremarkable.

## 2022-06-07 ENCOUNTER — Ambulatory Visit (LOCAL_COMMUNITY_HEALTH_CENTER): Payer: 59

## 2022-06-07 DIAGNOSIS — Z7185 Encounter for immunization safety counseling: Secondary | ICD-10-CM

## 2022-06-07 DIAGNOSIS — Z23 Encounter for immunization: Secondary | ICD-10-CM

## 2022-06-07 DIAGNOSIS — Z111 Encounter for screening for respiratory tuberculosis: Secondary | ICD-10-CM

## 2022-06-07 NOTE — Progress Notes (Signed)
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 No hx varicella vaccine or chickenpox. Presents school vaccine record and these were placed into [facility]. Tolerated varicella vaccine and ppd well today. Updated [facility] copy given. PPDR scheduled 06/10/2022, pt aware. Josie Saunders, RN

## 2022-06-10 ENCOUNTER — Ambulatory Visit (LOCAL_COMMUNITY_HEALTH_CENTER): Payer: 59

## 2022-06-10 DIAGNOSIS — Z111 Encounter for screening for respiratory tuberculosis: Secondary | ICD-10-CM

## 2022-06-10 LAB — TB SKIN TEST
Induration: 0 mm
TB Skin Test: NEGATIVE

## 2023-02-06 ENCOUNTER — Ambulatory Visit: Payer: 59

## 2023-02-13 ENCOUNTER — Ambulatory Visit: Payer: 59

## 2023-02-13 ENCOUNTER — Ambulatory Visit (LOCAL_COMMUNITY_HEALTH_CENTER): Payer: 59

## 2023-02-13 DIAGNOSIS — Z23 Encounter for immunization: Secondary | ICD-10-CM

## 2023-02-13 DIAGNOSIS — Z719 Counseling, unspecified: Secondary | ICD-10-CM

## 2023-02-13 NOTE — Progress Notes (Signed)
In nurse clinic for Varicella immunization. Voices no concerns. VIS given. Vaccine given and tolerated well. [facility] updated and copy given to patient.  Abagail Kitchens, RN

## 2023-07-25 ENCOUNTER — Other Ambulatory Visit: Payer: 59

## 2023-08-01 ENCOUNTER — Ambulatory Visit (LOCAL_COMMUNITY_HEALTH_CENTER): Payer: Self-pay

## 2023-08-01 DIAGNOSIS — Z111 Encounter for screening for respiratory tuberculosis: Secondary | ICD-10-CM

## 2023-08-04 ENCOUNTER — Ambulatory Visit: Payer: 59

## 2023-08-04 DIAGNOSIS — Z111 Encounter for screening for respiratory tuberculosis: Secondary | ICD-10-CM

## 2023-08-04 LAB — TB SKIN TEST
Induration: 0 mm
TB Skin Test: NEGATIVE
